# Patient Record
Sex: Male | Born: 1935 | Race: White | Hispanic: No | Marital: Married | Smoking: Former smoker
Health system: Southern US, Community
[De-identification: ages and names within clinical notes are randomized; demographics above are authoritative.]

## PROBLEM LIST (undated history)

## (undated) DIAGNOSIS — N3941 Urge incontinence: Secondary | ICD-10-CM

## (undated) DIAGNOSIS — K439 Ventral hernia without obstruction or gangrene: Secondary | ICD-10-CM

## (undated) DIAGNOSIS — N401 Enlarged prostate with lower urinary tract symptoms: Secondary | ICD-10-CM

## (undated) DIAGNOSIS — F0390 Unspecified dementia without behavioral disturbance: Secondary | ICD-10-CM

## (undated) HISTORY — PX: CATARACT EXTRACTION: SUR2

---

## 2007-09-29 ENCOUNTER — Ambulatory Visit (HOSPITAL_COMMUNITY): Admission: RE | Admit: 2007-09-29 | Discharge: 2007-09-29 | Payer: Self-pay | Admitting: General Surgery

## 2007-10-13 ENCOUNTER — Encounter (INDEPENDENT_AMBULATORY_CARE_PROVIDER_SITE_OTHER): Payer: Self-pay | Admitting: General Surgery

## 2007-10-13 ENCOUNTER — Inpatient Hospital Stay (HOSPITAL_COMMUNITY): Admission: RE | Admit: 2007-10-13 | Discharge: 2007-10-21 | Payer: Self-pay | Admitting: General Surgery

## 2008-03-21 ENCOUNTER — Ambulatory Visit (HOSPITAL_COMMUNITY): Admission: RE | Admit: 2008-03-21 | Discharge: 2008-03-21 | Payer: Self-pay | Admitting: Ophthalmology

## 2008-04-11 ENCOUNTER — Ambulatory Visit (HOSPITAL_COMMUNITY): Admission: RE | Admit: 2008-04-11 | Discharge: 2008-04-11 | Payer: Self-pay | Admitting: Ophthalmology

## 2010-03-02 ENCOUNTER — Emergency Department (HOSPITAL_COMMUNITY)
Admission: EM | Admit: 2010-03-02 | Discharge: 2010-03-02 | Payer: Self-pay | Source: Home / Self Care | Admitting: Emergency Medicine

## 2010-04-21 ENCOUNTER — Encounter: Payer: Self-pay | Admitting: General Surgery

## 2010-06-11 LAB — COMPREHENSIVE METABOLIC PANEL
ALT: 16 U/L (ref 0–53)
AST: 19 U/L (ref 0–37)
Albumin: 3.7 g/dL (ref 3.5–5.2)
Calcium: 9.1 mg/dL (ref 8.4–10.5)
GFR calc Af Amer: 51 mL/min — ABNORMAL LOW (ref >60)
Glucose, Bld: 105 mg/dL — ABNORMAL HIGH (ref 70–99)
Potassium: 4.3 mEq/L (ref 3.5–5.1)
Sodium: 139 mEq/L (ref 135–145)
Total Protein: 6.9 g/dL (ref 6.0–8.3)

## 2010-06-11 LAB — DIFFERENTIAL
Eosinophils Absolute: 0.2 10*3/uL (ref 0.0–0.7)
Lymphs Abs: 1.2 10*3/uL (ref 0.7–4.0)
Monocytes Absolute: 0.8 10*3/uL (ref 0.1–1.0)
Monocytes Relative: 9 % (ref 3–12)
Neutrophils Relative %: 77 % (ref 43–77)

## 2010-06-11 LAB — URINALYSIS, ROUTINE W REFLEX MICROSCOPIC
Glucose, UA: NEGATIVE mg/dL
Leukocytes, UA: NEGATIVE
pH: 6 (ref 5.0–8.0)

## 2010-06-11 LAB — URINE MICROSCOPIC-ADD ON

## 2010-06-11 LAB — CBC
MCHC: 35.7 g/dL (ref 30.0–36.0)
Platelets: 214 10*3/uL (ref 150–400)
RDW: 12.7 % (ref 11.5–15.5)
WBC: 9.4 10*3/uL (ref 4.0–10.5)

## 2010-08-13 NOTE — Discharge Summary (Signed)
Mark Pena, Mark Pena NO.:  [id number]   MEDICAL RECORD NO.:  [id number]          PATIENT TYPE:  INP   LOCATION:  A326                          FACILITY:  [facility]   PHYSICIAN:  Barbaraann Barthel, M.D. DATE OF BIRTH:  Aug 09, 1930   DATE OF ADMISSION:  10/13/2007  DATE OF DISCHARGE:  07/23/2009LH                               DISCHARGE SUMMARY   PROCEDURE:  1. On October 13, 2007, is repair of giant incarcerated right inguinal      hernia with MFT mesh graft  2. Excision of right hydrocele.  3. Right orchiectomy.  4. Umbilical hernia repair.  5. Aspiration of left hydrocele   Note, this is a 77 year old white male who presented with a giant right  inguinal hernia and bilateral hydroceles and an umbilical hernia.  Basically, he had had this incarcerated hernia for 10 years' time and  essentially according to CT scan, was noted to have great portion of his  small bowel in his right colon within his right scrotum.  We discussed  repair and the need for an orchiectomy and the need for a mesh  prosthesis for this we discussed this in detail and informed consent was  obtained and he was taken to surgery via the outpatient department on  October 13, 2007, where a repair was performed and the use of MTF graft was  used.  The patient postoperatively did well.  He did have the expected  large amount of lymphedema and swelling postoperatively.  We kept the  drains in places as much and removed these on the fifth or sixth  postoperative day.  He did well.  He did have some significant tissue  edema, but appeared to have no infection and remained afebrile  postoperatively and his wound remained clean.  Obviously, he was  difficult in ambulation and with this large scrotal edema, this required  prolonged catheterization and he actually did have some pitting edema  which I treated with the Lasix at the time of his discharge.  We  continued his antibiotics perioperatively a little longer  because of the  presence of the drains, however, this was discontinued when the drains  were removed.  I did discharge him on Bactrim because of the indwelling  catheter.  Postoperatively, he did very well.   LABORATORY DATA:  His pathology revealed benign tissue from his  incarcerated omentum and no sign of any malignancy in his scrotal area.  His white count was 5.0 on October 11, 2007, with an H&H of 14.8 and 43.5  and electrolytes were within normal limits.  Sonogram of the scrotum  revealed normal appearing testicles with bilateral hydroceles and a  large right intrascrotal hernia, sac containing numerous loops of small  bowel and large bowel as mentioned, and the CT scan revealed this as  well and obviously, this was found intraoperatively.   Electrocardiogram showed normal sinus rhythm with left bundle-branch  block.   The patient did as well as can be expected where he is in good spirits  and he was discharged on the eighth postoperative day.  At the time of  discharge, his wound was clean and we will keep the staples in place  longer and the catheters in place longer as mentioned above.   DISCHARGE INSTRUCTIONS:  He is discharged on a regular diet.  He is  excused from work.  He is to lie down on bed rest and he is permitted to  walk indoors and outdoors.  He is permitted to shower.  He is told to  clean his wound with alcohol 2 or 3 times a day or as needed.  He is to  do no heavy lifting or driving and no sexual activity.  He is told to  elevate his scrotum as much as possible.   He is discharged on:  1. Darvocet N 100 one tablet every 4 hours as needed for pain.  2. Colace 100 mg daily or 2 times a day as needed for his bowels.  3. Lasix 20 mg daily for 30 days.  4. K-Dur 20 mEq daily.  5. Bactrim DS 1 tablet 2 times a day.  6. Karaya powder around the groin area for irritation as needed.   We will follow up with him.  He is told to contact me, should there be  any  acute changes.  We have an appointment made with him on Tuesday,  October 26, 2007, at 10 o'clock and we will follow him perioperatively.      Barbaraann Barthel, M.D.  Electronically Signed     WB/MEDQ  D:  10/21/2007  T:  10/22/2007  Job:  [id number]

## 2010-08-13 NOTE — Op Note (Signed)
NAMEKASH, DAVIE NO.:  [id number]   MEDICAL RECORD NO.:  [id number]          PATIENT TYPE:  INP   LOCATION:  A326                          FACILITY:  [facility]   PHYSICIAN:  Barbaraann Barthel, M.D. DATE OF BIRTH:  08-20-1930   DATE OF PROCEDURE:  DATE OF DISCHARGE:                               OPERATIVE REPORT   SURGEON:  Barbaraann Barthel, MD   ASSISTANT:  Dr. Rito Ehrlich.   PREOPERATIVE DIAGNOSIS:  Giant incarcerated right inguinal hernia with  bilateral hydroceles and umbilical hernia.   PROCEDURE:  1. Repair of giant right inguinal incarcerated hernia with MTF      prosthesis.  2. Right hydrocelectomy.  3. Repair of incarcerated umbilical hernia.  4. Aspiration of left hydrocele.   NOTE:  This is a 77 year old white male who had a giant right inguinal  hernia for at least 10 years' time.  He was unfortunately nursing to  health his wife who was terminally ill and was unable to have this  repaired.  Preoperative workup showed him to have many loops of small  bowel as well as his right colon within his scrotum.  His scrotum was  down to his midthigh, and he also was noted to have bilateral  hydroceles.  He also had an umbilical hernia with herniated omentum.  We  discussed that repair of this in detail with him, discussing the  complications not limited to but including bleeding, infection, and the  possibility that the right testicle would have to be removed and due to  the large size of his defect and the ability to close the defect.  We  also discussed the use of mesh with this.  We also discussed the  possibility of bleeding and infection and recurrence.  Informed consent  was obtained.   GROSS OPERATIVE FINDINGS:  The patient had huge hernia sac containing  many loops of small bowel with thickened mesentery and the right colon  with normal appendix within it and normal testicle.  The patient had a  small umbilical hernia; however, with considerable  amount of omentum  that had herniated through this.   Specimen therefore was right inguinal hernia sac with right cord and  testicle and no incarcerated omentum from the umbilical hernia or and  the right inguinal hernia sac and then omentum, the tissue from this as  well.   TECHNIQUE:  The patient was placed in the supine position.  After the  adequate of administration of spinal anesthesia, his entire abdomen was  draped with Betadine solution and draped in the usual manner.  A Foley  catheter was aseptically inserted, and this also was prepped.  We made a  transverse incision over the area of the umbilical hernia to see what  size this defect was.  We removed incarcerated hernia.  The total size  of the defect was approximately the size of a silver dollar.  We then  packed this with a gauze, and then our attention was turned to the  scrotal area.  We left this open in case we needed an access from this  area for some  reason during the groin part of the surgery.  Incision was  carried out from the anterosuperior iliac spine on the right side down  into the scrotum through skin, subcutaneous tissues, Scarpa layer, and  opening the external oblique and revealing a large hernia sac.  This was  opened.  The hernia sac was then dissected free from the cord structures  and we had to open the defect somewhat in order to be able to return the  intestinal contents to the abdominal cavity.  We ligated the hernia sac  with 2-0 Bralon and amputated the redundant portion of the sac.  We then  ligated the cord structures with 0 Novofil with suture ligation and  doubly ligated this.  We then closed the defect suturing transversus  abdominis and transversalis fascia to the a piece of MTF graft, it was 8  x 16 and placed this over this area of the defect and sutured fascia,  transversus abdominis, and transversalis fascia all around the  circumference of the defect and suturing this medially to the  fascia in  the area of the tubercle in the Cooper ligament and Poupart ligament  inferiorly.  We used the entire piece of fascia in order to have no  tension or as little tension as possible.  We then irrigated with the  bacitracin and saline solution.  As there was a huge scrotal dilatation,  I elected to leave a Jackson-Pratt drain in the scrotum.  This exited  through a stab wound incision and this was sutured in place with 3-0  nylon.  We then tunneled the fascia from the midline of the abdomen down  to drain over the area of the MTF graft.  This was sutured in place and  as stated was placed through a separate stab wound incision.  I then  closed the subcutaneous tissue over the graft and then closed the skin  with a stapling device.  We then closed the umbilical hernia using old  Novofil suture in an interrupted figure-of-eight fashion in a transverse  manner to close the defect and then closed the skin with a stapling  device.  No drain was placed in this area.  Prior to closure, all  sponge, needle, and instrument counts were found to be correct.  Estimated blood loss was approximately 150 mL.  The patient received  3600 mL of crystalloids intraoperatively.  The patient was taken to the  recovery room in satisfactory condition.  There were no complications.  We did had to convert as this was a long procedure over 3 hours to  general anesthesia towards the end; however, this was not a problem.  The patient was taken to the recovery room in satisfactory condition.      Barbaraann Barthel, M.D.  Electronically Signed     WB/MEDQ  D:  10/13/2007  T:  10/14/2007  Job:  [id number]   cc:   Dr. Rito Ehrlich

## 2010-12-26 LAB — BASIC METABOLIC PANEL
BUN: 11
CO2: 27
Calcium: 9.5
Glucose, Bld: 105 — ABNORMAL HIGH
Sodium: 139

## 2010-12-26 LAB — DIFFERENTIAL
Basophils Absolute: 0
Basophils Relative: 1
Eosinophils Relative: 3
Monocytes Absolute: 0.5
Neutro Abs: 3.1

## 2010-12-26 LAB — CBC
HCT: 43.5
Hemoglobin: 14.8
MCHC: 34
RDW: 12.9

## 2010-12-27 LAB — BASIC METABOLIC PANEL
BUN: 9
Creatinine, Ser: 1.06
GFR calc non Af Amer: >60

## 2010-12-27 LAB — DIFFERENTIAL
Lymphocytes Relative: 6 — ABNORMAL LOW
Lymphs Abs: 0.7
Neutrophils Relative %: 83 — ABNORMAL HIGH

## 2010-12-27 LAB — CBC
Platelets: 218
WBC: 11.3 — ABNORMAL HIGH

## 2011-01-03 LAB — BASIC METABOLIC PANEL
CO2: 29 mEq/L (ref 19–32)
Calcium: 9.2 mg/dL (ref 8.4–10.5)
Chloride: 105 mEq/L (ref 96–112)
GFR calc Af Amer: >60 mL/min (ref >60)
Sodium: 141 mEq/L (ref 135–145)

## 2011-01-03 LAB — HEMOGLOBIN AND HEMATOCRIT, BLOOD: HCT: 44 % (ref 39.0–52.0)

## 2012-10-11 ENCOUNTER — Other Ambulatory Visit (HOSPITAL_COMMUNITY): Payer: Self-pay | Admitting: General Surgery

## 2012-10-11 DIAGNOSIS — R410 Disorientation, unspecified: Secondary | ICD-10-CM

## 2012-10-14 ENCOUNTER — Ambulatory Visit (HOSPITAL_COMMUNITY)
Admission: RE | Admit: 2012-10-14 | Discharge: 2012-10-14 | Disposition: A | Discharge status: Home / Self Care | Payer: Medicare Other | Source: Ambulatory Visit | Attending: General Surgery | Admitting: General Surgery

## 2012-10-14 ENCOUNTER — Other Ambulatory Visit (HOSPITAL_COMMUNITY): Payer: Self-pay | Admitting: General Surgery

## 2012-10-14 DIAGNOSIS — R410 Disorientation, unspecified: Secondary | ICD-10-CM

## 2012-10-14 DIAGNOSIS — I6529 Occlusion and stenosis of unspecified carotid artery: Secondary | ICD-10-CM | POA: Insufficient documentation

## 2012-10-14 DIAGNOSIS — G319 Degenerative disease of nervous system, unspecified: Secondary | ICD-10-CM | POA: Insufficient documentation

## 2012-10-14 DIAGNOSIS — R4182 Altered mental status, unspecified: Secondary | ICD-10-CM | POA: Insufficient documentation

## 2015-05-03 ENCOUNTER — Inpatient Hospital Stay (HOSPITAL_COMMUNITY): Payer: Medicare (Managed Care)

## 2015-05-03 ENCOUNTER — Emergency Department (HOSPITAL_COMMUNITY): Payer: Medicare (Managed Care)

## 2015-05-03 ENCOUNTER — Inpatient Hospital Stay (HOSPITAL_COMMUNITY)
Admission: EM | Admit: 2015-05-03 | Discharge: 2015-05-07 | DRG: 071 | Disposition: A | Discharge status: Skilled Nursing Facility | Payer: Medicare (Managed Care) | Attending: Internal Medicine | Admitting: Internal Medicine

## 2015-05-03 ENCOUNTER — Encounter (HOSPITAL_COMMUNITY): Payer: Self-pay | Admitting: Emergency Medicine

## 2015-05-03 DIAGNOSIS — N400 Enlarged prostate without lower urinary tract symptoms: Secondary | ICD-10-CM | POA: Diagnosis present

## 2015-05-03 DIAGNOSIS — K920 Hematemesis: Secondary | ICD-10-CM | POA: Diagnosis not present

## 2015-05-03 DIAGNOSIS — G9340 Encephalopathy, unspecified: Secondary | ICD-10-CM | POA: Diagnosis present

## 2015-05-03 DIAGNOSIS — Z66 Do not resuscitate: Secondary | ICD-10-CM | POA: Diagnosis not present

## 2015-05-03 DIAGNOSIS — R2681 Unsteadiness on feet: Secondary | ICD-10-CM | POA: Diagnosis present

## 2015-05-03 DIAGNOSIS — N189 Chronic kidney disease, unspecified: Secondary | ICD-10-CM

## 2015-05-03 DIAGNOSIS — R6510 Systemic inflammatory response syndrome (SIRS) of non-infectious origin without acute organ dysfunction: Secondary | ICD-10-CM | POA: Diagnosis present

## 2015-05-03 DIAGNOSIS — L89321 Pressure ulcer of left buttock, stage 1: Secondary | ICD-10-CM | POA: Diagnosis not present

## 2015-05-03 DIAGNOSIS — F0150 Vascular dementia without behavioral disturbance: Secondary | ICD-10-CM | POA: Diagnosis present

## 2015-05-03 DIAGNOSIS — K439 Ventral hernia without obstruction or gangrene: Secondary | ICD-10-CM | POA: Diagnosis present

## 2015-05-03 DIAGNOSIS — R4182 Altered mental status, unspecified: Secondary | ICD-10-CM

## 2015-05-03 DIAGNOSIS — N179 Acute kidney failure, unspecified: Secondary | ICD-10-CM | POA: Diagnosis present

## 2015-05-03 DIAGNOSIS — T50995A Adverse effect of other drugs, medicaments and biological substances, initial encounter: Secondary | ICD-10-CM | POA: Diagnosis present

## 2015-05-03 DIAGNOSIS — Z9181 History of falling: Secondary | ICD-10-CM

## 2015-05-03 DIAGNOSIS — Z79899 Other long term (current) drug therapy: Secondary | ICD-10-CM

## 2015-05-03 DIAGNOSIS — L89151 Pressure ulcer of sacral region, stage 1: Secondary | ICD-10-CM | POA: Diagnosis present

## 2015-05-03 DIAGNOSIS — R0602 Shortness of breath: Secondary | ICD-10-CM

## 2015-05-03 DIAGNOSIS — F0151 Vascular dementia with behavioral disturbance: Secondary | ICD-10-CM | POA: Diagnosis present

## 2015-05-03 DIAGNOSIS — R509 Fever, unspecified: Secondary | ICD-10-CM | POA: Diagnosis not present

## 2015-05-03 DIAGNOSIS — N183 Chronic kidney disease, stage 3 (moderate): Secondary | ICD-10-CM | POA: Diagnosis present

## 2015-05-03 DIAGNOSIS — L8990 Pressure ulcer of unspecified site, unspecified stage: Secondary | ICD-10-CM | POA: Insufficient documentation

## 2015-05-03 HISTORY — DX: Benign prostatic hyperplasia with lower urinary tract symptoms: N40.1

## 2015-05-03 HISTORY — DX: Unspecified dementia, unspecified severity, without behavioral disturbance, psychotic disturbance, mood disturbance, and anxiety: F03.90

## 2015-05-03 HISTORY — DX: Urge incontinence: N39.41

## 2015-05-03 HISTORY — DX: Ventral hernia without obstruction or gangrene: K43.9

## 2015-05-03 LAB — CBC WITH DIFFERENTIAL/PLATELET
BASOS ABS: 0 10*3/uL (ref 0.0–0.1)
Basophils Relative: 0 %
EOS ABS: 0 10*3/uL (ref 0.0–0.7)
Eosinophils Relative: 0 %
HCT: 44 % (ref 39.0–52.0)
HEMOGLOBIN: 15.2 g/dL (ref 13.0–17.0)
LYMPHS ABS: 0.6 10*3/uL — AB (ref 0.7–4.0)
LYMPHS PCT: 6 %
MCH: 32.1 pg (ref 26.0–34.0)
MCHC: 34.5 g/dL (ref 30.0–36.0)
MCV: 92.8 fL (ref 78.0–100.0)
Monocytes Absolute: 0.7 10*3/uL (ref 0.1–1.0)
Monocytes Relative: 7 %
NEUTROS PCT: 87 %
Neutro Abs: 9.1 10*3/uL — ABNORMAL HIGH (ref 1.7–7.7)
Platelets: 236 10*3/uL (ref 150–400)
RBC: 4.74 MIL/uL (ref 4.22–5.81)
RDW: 12.9 % (ref 11.5–15.5)
WBC: 10.4 10*3/uL (ref 4.0–10.5)

## 2015-05-03 LAB — URINALYSIS, ROUTINE W REFLEX MICROSCOPIC
Glucose, UA: NEGATIVE mg/dL
HGB URINE DIPSTICK: NEGATIVE
Ketones, ur: 40 mg/dL — AB
LEUKOCYTES UA: NEGATIVE
Nitrite: NEGATIVE
PROTEIN: NEGATIVE mg/dL
SPECIFIC GRAVITY, URINE: 1.029 (ref 1.005–1.030)
pH: 5.5 (ref 5.0–8.0)

## 2015-05-03 LAB — POCT I-STAT 3, ART BLOOD GAS (G3+)
ACID-BASE DEFICIT: 2 mmol/L (ref 0.0–2.0)
Bicarbonate: 22.7 mEq/L (ref 20.0–24.0)
O2 SAT: 91 %
PH ART: 7.382 (ref 7.350–7.450)
TCO2: 24 mmol/L (ref 0–100)
pCO2 arterial: 38.5 mmHg (ref 35.0–45.0)
pO2, Arterial: 64 mmHg — ABNORMAL LOW (ref 80.0–100.0)

## 2015-05-03 LAB — COMPREHENSIVE METABOLIC PANEL
ALT: 16 U/L — ABNORMAL LOW (ref 17–63)
ANION GAP: 12 (ref 5–15)
AST: 42 U/L — ABNORMAL HIGH (ref 15–41)
Albumin: 3.3 g/dL — ABNORMAL LOW (ref 3.5–5.0)
Alkaline Phosphatase: 84 U/L (ref 38–126)
BUN: 23 mg/dL — ABNORMAL HIGH (ref 6–20)
CHLORIDE: 107 mmol/L (ref 101–111)
CO2: 22 mmol/L (ref 22–32)
Calcium: 8.9 mg/dL (ref 8.9–10.3)
Creatinine, Ser: 1.14 mg/dL (ref 0.61–1.24)
GFR calc non Af Amer: 58 mL/min — ABNORMAL LOW (ref >60)
Glucose, Bld: 120 mg/dL — ABNORMAL HIGH (ref 65–99)
POTASSIUM: 4.9 mmol/L (ref 3.5–5.1)
SODIUM: 141 mmol/L (ref 135–145)
Total Bilirubin: 2.2 mg/dL — ABNORMAL HIGH (ref 0.3–1.2)
Total Protein: 6.7 g/dL (ref 6.5–8.1)

## 2015-05-03 LAB — I-STAT CG4 LACTIC ACID, ED
LACTIC ACID, VENOUS: 1.24 mmol/L (ref 0.5–2.0)
Lactic Acid, Venous: 1.27 mmol/L (ref 0.5–2.0)

## 2015-05-03 LAB — MRSA PCR SCREENING: MRSA BY PCR: NEGATIVE

## 2015-05-03 LAB — VALPROIC ACID LEVEL

## 2015-05-03 MED ORDER — SODIUM CHLORIDE 0.9 % IV BOLUS (SEPSIS)
1000.0000 mL | Freq: Once | INTRAVENOUS | Status: AC
  Administered 2015-05-03: 1000 mL via INTRAVENOUS

## 2015-05-03 MED ORDER — ONDANSETRON HCL 4 MG PO TABS: 4.0000 mg | ORAL_TABLET | Freq: Four times a day (QID) | ORAL | Status: DC | PRN

## 2015-05-03 MED ORDER — PIPERACILLIN-TAZOBACTAM 3.375 G IVPB 30 MIN
3.3750 g | Freq: Once | INTRAVENOUS | Status: AC
  Administered 2015-05-03: 3.375 g via INTRAVENOUS
  Filled 2015-05-03: qty 50

## 2015-05-03 MED ORDER — LORAZEPAM 2 MG/ML IJ SOLN
1.0000 mg | Freq: Once | INTRAMUSCULAR | Status: AC | PRN
  Administered 2015-05-03: 1 mg via INTRAVENOUS
  Filled 2015-05-03: qty 1

## 2015-05-03 MED ORDER — PANTOPRAZOLE SODIUM 40 MG IV SOLR
40.0000 mg | Freq: Two times a day (BID) | INTRAVENOUS | Status: DC
  Administered 2015-05-03 – 2015-05-04 (×3): 40 mg via INTRAVENOUS
  Filled 2015-05-03 (×3): qty 40

## 2015-05-03 MED ORDER — ENOXAPARIN SODIUM 40 MG/0.4ML ~~LOC~~ SOLN
40.0000 mg | SUBCUTANEOUS | Status: DC
  Administered 2015-05-03 – 2015-05-06 (×3): 40 mg via SUBCUTANEOUS
  Filled 2015-05-03 (×3): qty 0.4

## 2015-05-03 MED ORDER — DIVALPROEX SODIUM 125 MG PO CSDR
125.0000 mg | DELAYED_RELEASE_CAPSULE | Freq: Two times a day (BID) | ORAL | Status: DC
  Filled 2015-05-03 (×2): qty 1

## 2015-05-03 MED ORDER — VANCOMYCIN HCL IN DEXTROSE 1-5 GM/200ML-% IV SOLN
1000.0000 mg | Freq: Once | INTRAVENOUS | Status: AC
  Administered 2015-05-03: 1000 mg via INTRAVENOUS
  Filled 2015-05-03: qty 200

## 2015-05-03 MED ORDER — RISPERIDONE 0.5 MG PO TABS
0.2500 mg | ORAL_TABLET | Freq: Every day | ORAL | Status: DC
  Filled 2015-05-03: qty 1

## 2015-05-03 MED ORDER — FINASTERIDE 5 MG PO TABS
5.0000 mg | ORAL_TABLET | Freq: Every day | ORAL | Status: DC
  Administered 2015-05-06 – 2015-05-07 (×2): 5 mg via ORAL
  Filled 2015-05-03 (×2): qty 1

## 2015-05-03 MED ORDER — SODIUM CHLORIDE 0.9 % IV SOLN: INTRAVENOUS | Status: AC

## 2015-05-03 MED ORDER — TAMSULOSIN HCL 0.4 MG PO CAPS
0.4000 mg | ORAL_CAPSULE | Freq: Every day | ORAL | Status: DC
  Administered 2015-05-06 – 2015-05-07 (×2): 0.4 mg via ORAL
  Filled 2015-05-03 (×2): qty 1

## 2015-05-03 MED ORDER — SODIUM CHLORIDE 0.9 % IV SOLN
INTRAVENOUS | Status: DC
  Administered 2015-05-03: 08:00:00 via INTRAVENOUS

## 2015-05-03 MED ORDER — ALBUTEROL SULFATE (2.5 MG/3ML) 0.083% IN NEBU: 2.5000 mg | INHALATION_SOLUTION | RESPIRATORY_TRACT | Status: DC | PRN

## 2015-05-03 MED ORDER — SODIUM CHLORIDE 0.9 % IV SOLN: INTRAVENOUS | Status: DC

## 2015-05-03 MED ORDER — ONDANSETRON HCL 4 MG/2ML IJ SOLN: 4.0000 mg | Freq: Four times a day (QID) | INTRAMUSCULAR | Status: DC | PRN

## 2015-05-03 MED ORDER — VANCOMYCIN HCL IN DEXTROSE 750-5 MG/150ML-% IV SOLN
750.0000 mg | Freq: Two times a day (BID) | INTRAVENOUS | Status: DC
  Filled 2015-05-03: qty 150

## 2015-05-03 MED ORDER — PIPERACILLIN-TAZOBACTAM 3.375 G IVPB
3.3750 g | Freq: Three times a day (TID) | INTRAVENOUS | Status: DC
  Administered 2015-05-03 – 2015-05-04 (×3): 3.375 g via INTRAVENOUS
  Filled 2015-05-03 (×6): qty 50

## 2015-05-03 MED ORDER — ACETAMINOPHEN 325 MG PO TABS: 650.0000 mg | ORAL_TABLET | Freq: Four times a day (QID) | ORAL | Status: DC | PRN

## 2015-05-03 MED ORDER — ACETAMINOPHEN 650 MG RE SUPP: 650.0000 mg | Freq: Four times a day (QID) | RECTAL | Status: DC | PRN

## 2015-05-03 NOTE — Progress Notes (Signed)
Patient arrived to room 5M15 from unit 2H at this time. Easily arousable.Mark Pena

## 2015-05-03 NOTE — H&P (Addendum)
[facility] History and Physical  Mark Pena ZOX:[id number] DOB: 04/21/1930 DOA: 05/03/2015  Referring physician: ED PCP: Mark Hatcher, MD   Chief Complaint: Altered mental status  HPI:  Mark Pena is a 82 year old male with a past medical history of vascular dementia and BPH; presents from [facility] nursing facility with complaints of acute altered mental status and thought to have vomited coffee ground. Patient's history is obtained from his wife as he is acutely altered and in not responding to verbal commands.   Patient wife states that he had been living at home and going to the pace program during the day until approximately 3 days ago when they recommended the patient to go into [facility] nursing facility for medication adjustments. Wife noted that he had been becoming more confused lately and needing more care. Reporting increased sundowning from the history of vascular dementia. She notes that he would be up multiple times throughout the night and had just last fallen 5 days ago. Patient was able to get back up without problem as she states he usually does. Wife notes that she was called around 12:45 AM this morning and told that the patient was responsive and coughing up coffee ground material thought to be blood. She she was told by the facility that he had eaten his lunch as normal. She notes that prior to joining the pace program. Previously he had only been on on vitamin, finasteride, and tamulosin. After he joined the pace program she states he was placed on risperidone and mirtazapineand has been on this for approximately a week or so.  She states she's not sure but she thinks Depakote and Haldol were added at [facility] nursing facility. They state his last dose of medication was Monday, but records are not available. Patient was noted to have a fever 100.4 at the nursing facility. At baseline the patient is able to feed himself, hold normal conversation, and  walk without the assistance of a cane. She notes she is unsure if patient has a new facial droop as he does not have his dentures and cannot be sure.   Upon arrival the patient is seen to have a facial droop, but blood levels appear to be within normal limits.  he is found to be initially tachycardic with heart rate of 104 and a low-grade fever of 100.45F. Initial CT scan showed no signs of acute infarct chest x-ray showed. Low lung volumes but it was suspected that the patient possibly could be aspirating. Wife states that she wants patient to be a full code but is unsure of whether she would like the patient to be intubated are not if needed.    Review of Systems  Unable to perform ROS: patient unresponsive     Past Medical History  Diagnosis Date  . Dementia   . Benign prostatic hyperplasia (BPH) with urinary urge incontinence   . Abdominal wall hernia      History reviewed. No pertinent past surgical history.    Social History:  reports that he does not drink alcohol. His tobacco and drug histories are not on file.   No Known Allergies  History reviewed. No pertinent family history.      Prior to Admission medications   Medication Sig Start Date End Date Taking? Authorizing Provider  divalproex (DEPAKOTE SPRINKLE) 125 MG capsule Take 125 mg by mouth 2 (two) times daily.   Yes Historical Provider, MD  finasteride (PROSCAR) 5 MG tablet Take 5 mg by mouth  daily.   Yes Historical Provider, MD  haloperidol (HALDOL) 0.5 MG tablet Take 0.5 mg by mouth every 6 (six) hours as needed for agitation.   Yes Historical Provider, MD  mirtazapine (REMERON) 15 MG tablet Take 15 mg by mouth at bedtime.   Yes Historical Provider, MD  risperiDONE (RISPERDAL) 0.25 MG tablet Take 0.25 mg by mouth 2 (two) times daily.   Yes Historical Provider, MD  tamsulosin (FLOMAX) 0.4 MG CAPS capsule Take 0.4 mg by mouth daily.   Yes Historical Provider, MD  Vitamin D, Ergocalciferol, (DRISDOL) 50000 units  CAPS capsule Take 50,000 Units by mouth every 30 (thirty) days.   Yes Historical Provider, MD     Physical Exam: Filed Vitals:   05/03/15 0448 05/03/15 0517 05/03/15 0530 05/03/15 0545  BP: 131/71 123/68 133/71 122/67  Pulse: 94 87 90 86  Temp:      TempSrc:      Resp: 24 24 31 23   Height:      Weight:      SpO2: 96% 98% 97% 99%     Constitutional: Vital signs reviewed. Patient is lethargic with intermittent weak cough minimally arousable to verbal stimuli from wife Head: Normocephalic and atraumatic  Ear: TM normal bilaterally  Mouth: no erythema or exudates,  dry mucous membranes cannot appreciate coffee-ground hematemesis  Eyes: PERRL, EOMI, conjunctivae normal, No scleral icterus.  Neck: Supple, Trachea midline normal ROM, No JVD, mass, thyromegaly, or carotid bruit present.  Cardiovascular: RRR, S1 normal, S2 normal, no MRG, pulses symmetric and intact bilaterally  Pulmonary/Chest:decreased overall air movement Abdominal: Soft. Non-tender, non-distended, RLQ ventral wall hernia that appears reducible,positive bowel sounds in all 4 quadrants no guarding present. GU: no CVA tenderness Musculoskeletal: No joint deformities, erythema, or stiffness, ROM full and no nontender Ext: no edema and no cyanosis, pulses palpable bilaterally (DP and PT)  Hematology: no cervical, inginal, or axillary adenopathy.  Neurological: Able to move all extremities with painful stimuli. Appears to have a right-sided facial droop  Skin: Warm, dry. Pressure ulcer present  Psychiatric: Normal mood and affect. speech and behavior is normal. Judgment and thought content normal. Cognition and memory are normal.      Data Review   Micro Results No results found for this or any previous visit (from the past 240 hour(s)).  Radiology Reports Dg Chest 2 View  05/03/2015  CLINICAL DATA:  Altered mental status. EXAM: CHEST  2 VIEW COMPARISON:  11/04/2014 FINDINGS: Low volumes with streaky basilar opacity.  Chronic cardiomegaly. Vascular pedicle widening which is greater than prior, but may be related to rightward rotation. No edema, effusion, or air leak. IMPRESSION: Low volumes with basilar atelectasis. Electronically Signed   By: Marnee Spring M.D.   On: 05/03/2015 02:32   Ct Head Wo Contrast  05/03/2015  CLINICAL DATA:  Coffee-ground emesis with altered mental status and left-sided weakness. EXAM: CT HEAD WITHOUT CONTRAST TECHNIQUE: Contiguous axial images were obtained from the base of the skull through the vertex without intravenous contrast. COMPARISON:  11/04/2014 FINDINGS: Skull and Sinuses:Negative for fracture or destructive process. Chronic sinusitis with diffuse mucosal thickening. Visualized orbits: Bilateral cataract resection. Brain: Advanced chronic small vessel disease with ischemic gliosis confluent throughout the bilateral cerebral white matter and present in the pons. Indistinct and low-density basal ganglia on a chronic basis, likely ischemic gliosis and dilated perivascular spaces from hypertension. Chronic bilateral thalamic lacunar infarcts. Chronic small vessel infarct in the peripheral right cerebellum. Chronic ventriculomegaly, favor atrophy over chronic communicating hydrocephalus. IMPRESSION:  1. No acute finding or change from 2016. 2. Advanced chronic small vessel disease. Electronically Signed   By: Marnee Spring M.D.   On: 05/03/2015 04:56     CBC  Recent Labs Lab 05/03/15 0305  WBC 10.4  HGB 15.2  HCT 44.0  PLT 236  MCV 92.8  MCH 32.1  MCHC 34.5  RDW 12.9  LYMPHSABS 0.6*  MONOABS 0.7  EOSABS 0.0  BASOSABS 0.0    Chemistries   Recent Labs Lab 05/03/15 0305  NA 141  K 4.9  CL 107  CO2 22  GLUCOSE 120*  BUN 23*  CREATININE 1.14  CALCIUM 8.9  AST 42*  ALT 16*  ALKPHOS 84  BILITOT 2.2*   ------------------------------------------------------------------------------------------------------------------ estimated creatinine clearance is 54.8  mL/min (by C-G formula based on Cr of 1.14). ------------------------------------------------------------------------------------------------------------------ No results for input(s): HGBA1C in the last 72 hours. ------------------------------------------------------------------------------------------------------------------ No results for input(s): CHOL, HDL, LDLCALC, TRIG, CHOLHDL, LDLDIRECT in the last 72 hours. ------------------------------------------------------------------------------------------------------------------ No results for input(s): TSH, T4TOTAL, T3FREE, THYROIDAB in the last 72 hours.  Invalid input(s): FREET3 ------------------------------------------------------------------------------------------------------------------ No results for input(s): VITAMINB12, FOLATE, FERRITIN, TIBC, IRON, RETICCTPCT in the last 72 hours.  Coagulation profile No results for input(s): INR, PROTIME in the last 168 hours.  No results for input(s): DDIMER in the last 72 hours.  Cardiac Enzymes No results for input(s): CKMB, TROPONINI, MYOGLOBIN in the last 168 hours.  Invalid input(s): CK ------------------------------------------------------------------------------------------------------------------ Invalid input(s): POCBNP   CBG: No results for input(s): GLUCAP in the last 168 hours.     EKG: Independently reviewed. sinus tachycardia with a heart rate of 106.    Assessment/Plan  Acute encephalopathy:  patient acutely altered wife notes multiple recent addition's medicine including Haldol and Depakote. Last dose is unknown. CT of the brain showed no acute abnormalities. Question of patient symptoms being secondary to recent medications versus possibility of acute stroke versus underlying infection.  - Admit to stepdown unit  - Obtain records from [facility] regarding  medication administration - Continuous pulse oximetry with [place] Oxygen to keep O2 sats greater than 92% -  check MRI to rule out the possibility of underlying infarct - Check ABG now - Hold home medications    SIRS/Possible Sepsis: Acute. Patient with temperatures up to 100.61F at nursing facility, with increased respiratory rate, and mild tachycardia on admission.  UA wnl. Chest x-ray showed low lung volumes. Patient possibly aspirated secondary to decreased level of consciousness. - Follow-up blood cultures - aspiration precautions head of bed at 30  - Empiric Abx Vancomycin and Zosyn  - Tylenol prn fever  Possible hematemesis:  Per report prior to arrival  not seen on admission. H&H appears within normal limits on admission. - recheck H&H - Protonix 40 mg IV every 12 hours - Zofran as needed for nausea    Possible acute kidney injury on chronic kidney disease: Baseline creatinine appears previously had to have been less than 1 multiple years ago. Acutely on admission patient's creatinine is 1.14 with a BUN elevated at 23. Suspect some aspect of prerenal cause.  - Gentle IV fluids   History of vascular dementia: Wife notes the patient had been calm more difficult to take care home because of sundowning. - Consult social work  Elevated AST: Mild elevation of AST at 42 - Continue to monitor  Falls  - Physical therapy   BPH -Hold as patient is unresponsive   Pressure ulcer - Consult wound care - Lower air  mattress  Code Status:  full please rediscuss with family to solidify  Family Communication: bedside Disposition Plan: admit   Total time spent 55 minutes.Greater than 50% of this time was spent in counseling, explanation of diagnosis, planning of further management, and coordination of care  Clydie Braun [facility] Pager [phone]  If 7PM-7AM, please contact night-coverage [email or web address] Password [email or web address] 05/03/2015, 5:59 AM

## 2015-05-03 NOTE — Consult Note (Addendum)
WOC wound consult note Reason for Consult: Consult requested for sacrum Wound type: Stage 1 Pressure Ulcer POA: Yes Measurement: Approx 2X2cm nonblanchable red area to sacrum/upper buttocks Dressing procedure/placement/frequency: Barrier cream to protect and repel moisture, position off affected area. Please re-consult if further assistance is needed.  Thank-you,  Cammie Mcgee MSN, RN, CWOCN, [place], CNS [phone]

## 2015-05-03 NOTE — Progress Notes (Signed)
SLP Cancellation Note  Patient Details Name: Mark Pena MRN: [id number] DOB: June 28, 1930   Cancelled treatment:        Attempted swallow assessment. Unable to adequately wake pt for evaluation. Will return next date.   Royce Macadamia 05/03/2015, 2:22 PM  Breck Coons Lonell Face.Ed [employer] [phone]

## 2015-05-03 NOTE — ED Notes (Signed)
Dr.Ward at bedside with wife, discussing intubation options

## 2015-05-03 NOTE — Progress Notes (Signed)
Pharmacy Antibiotic Note  TRAMEL WESTBROOK is a 82 y.o. male admitted on 05/03/2015 with emesis/AMS/rule out sepsis.  Pharmacy has been consulted for Vancomycin/Zosyn.   Plan: -Vancomycin 750 mg IV q12h -Zosyn 3.375G IV q8h to be infused over 4 hours -Trend WBC, temp, renal function  -Drug levels as indicated   Height: 6' (182.9 cm) Weight: 201 lb (91.173 kg) IBW/kg (Calculated) : 77.6  Temp (24hrs), Avg:100.1 F (37.8 C), Min:100.1 F (37.8 C), Max:100.1 F (37.8 C)   Recent Labs Lab 05/03/15 0305 05/03/15 0317 05/03/15 0538  WBC 10.4  --   --   CREATININE 1.14  --   --   LATICACIDVEN  --  1.24 1.27    Estimated Creatinine Clearance: 54.8 mL/min (by C-G formula based on Cr of 1.14).    No Known Allergies   Thank you for allowing pharmacy to be a part of this patient's care.  Abran Duke 05/03/2015 5:55 AM

## 2015-05-03 NOTE — Progress Notes (Signed)
MD wants patient to be medicated prior to MRI, orders written. Notified Claudia in MRI that patient has orders for pre medication prior to MRI. MRI to notify nurse when they are ready to re attempt MRI.

## 2015-05-03 NOTE — Progress Notes (Addendum)
PATIENT DETAILS Name: Mark Pena Age: 82 y.o. Sex: male Date of Birth: July 05, 1930 Admit Date: 05/03/2015 Admitting Physician Clydie Braun, MD ZOX:[id number] S, MD  Subjective: Awake-mostly alert this morning. No further vomiting or hematemesis.  Assessment/Plan: Principal Problem: Acute encephalopathy: Not sure if this is related to recent initiation of multiple medications-with low-grade fever patient-and history of dementia-could have some mild aspiration pneumonia causing worsening confusion as well. Alternatively-worsening confusion could be part of his dementia as well. Await MRI brain. Currently seems alert and answering some questions appropriately. Discontinue vancomycin, for now would continue Zosyn-resume Depakote and Risperdal daily at bedtime and follow clinical course. Transfer to MedSurg unit.  Active Problems: Fever/  SIRS (systemic inflammatory response syndrome): No foci of infection evident-given mental status-always some suspicion of mild aspiration pneumonia not seen on chest x-ray. Stop vancomycin, continue Zosyn today-repeat chest x-ray in a.m.-suspect we could discontinue or even narrow down antibiotics further tomorrow. Follow cultures  Hematemesis: One episode only-hemoglobin stable. No indication for endoscopic studies at this time. Would only pursue GI consultation/endoscopic studies if significant drop in hemoglobin or recurrent hematemesis.   ARF: Suspect prerenal azotemia, resolved with hydration. Check electrolytes periodically.  Vascular dementia: see above-resuming low-Depakote and Risperdal daily at bedtime. Follow.   Patient follows with [facility] program-internal medicine teaching service will resume primary service on 05/04/15   Disposition: Remain inpatient-transfer to med surg  Antimicrobial agents  See below  Anti-infectives    Start     Dose/Rate Route Frequency Ordered Stop   05/03/15 1800  vancomycin (VANCOCIN)  IVPB 750 mg/150 ml premix  Status:  Discontinued     750 mg 150 mL/hr over 60 Minutes Intravenous Every 12 hours 05/03/15 0558 05/03/15 1033   05/03/15 1400  piperacillin-tazobactam (ZOSYN) IVPB 3.375 g     3.375 g 12.5 mL/hr over 240 Minutes Intravenous 3 times per day 05/03/15 0558     05/03/15 0400  piperacillin-tazobactam (ZOSYN) IVPB 3.375 g     3.375 g 100 mL/hr over 30 Minutes Intravenous  Once 05/03/15 0349 05/03/15 0530   05/03/15 0400  vancomycin (VANCOCIN) IVPB 1000 mg/200 mL premix     1,000 mg 200 mL/hr over 60 Minutes Intravenous  Once 05/03/15 0349 05/03/15 0601      DVT Prophylaxis: Prophylactic Lovenox   Code Status: DNI  Family Communication Spouse over the phone  Procedures: None  CONSULTS:  None  Time spent 30 minutes-Greater than 50% of this time was spent in counseling, explanation of diagnosis, planning of further management, and coordination of care.  MEDICATIONS: Scheduled Meds: . divalproex  125 mg Oral BID  . enoxaparin (LOVENOX) injection  40 mg Subcutaneous Q24H  . finasteride  5 mg Oral Daily  . pantoprazole (PROTONIX) IV  40 mg Intravenous Q12H  . piperacillin-tazobactam (ZOSYN)  IV  3.375 g Intravenous 3 times per day  . risperiDONE  0.25 mg Oral QHS  . tamsulosin  0.4 mg Oral Daily   Continuous Infusions: . sodium chloride 75 mL/hr at 05/03/15 0814   PRN Meds:.acetaminophen **OR** acetaminophen, albuterol, ondansetron **OR** ondansetron (ZOFRAN) IV    PHYSICAL EXAM: Vital signs in last 24 hours: Filed Vitals:   05/03/15 0545 05/03/15 0637 05/03/15 0645 05/03/15 0738  BP: 122/67 113/70 112/74 129/76  Pulse: 86 98 95 89  Temp:   97.9 F (36.6 C) 97.9 F (36.6 C)  TempSrc:   Oral Oral  Resp: 23  Height:  6' (1.829 m)    Weight:  88.4 kg (194 lb 14.2 oz)    SpO2: 99% 95% 94% 94%    Weight change:  Filed Weights   05/03/15 0444 05/03/15 0637  Weight: 91.173 kg (201 lb) 88.4 kg (194 lb 14.2 oz)   Body mass  index is 26.43 kg/(m^2).   Gen Exam: Awake-pleasantly confused-"mumbled speech".   Neck: Supple, No JVD.   Chest: B/L Clear.   CVS: S1 S2 Regular, no murmurs. Abdomen: soft, BS +, non tender, non distended.  Extremities: no edema, lower extremities warm to touch. Neurologic: Non Focal-but gen weakness   Skin: No Rash.   Wounds: N/A.    Intake/Output from previous day:  Intake/Output Summary (Last 24 hours) at 05/03/15 1037 Last data filed at 05/03/15 0325  Gross per 24 hour  Intake      0 ml  Output    200 ml  Net   -200 ml     LAB RESULTS: CBC  Recent Labs Lab 05/03/15 0305  WBC 10.4  HGB 15.2  HCT 44.0  PLT 236  MCV 92.8  MCH 32.1  MCHC 34.5  RDW 12.9  LYMPHSABS 0.6*  MONOABS 0.7  EOSABS 0.0  BASOSABS 0.0    Chemistries   Recent Labs Lab 05/03/15 0305  NA 141  K 4.9  CL 107  CO2 22  GLUCOSE 120*  BUN 23*  CREATININE 1.14  CALCIUM 8.9    CBG: No results for input(s): GLUCAP in the last 168 hours.  GFR Estimated Creatinine Clearance: 54.8 mL/min (by C-G formula based on Cr of 1.14).  Coagulation profile No results for input(s): INR, PROTIME in the last 168 hours.  Cardiac Enzymes No results for input(s): CKMB, TROPONINI, MYOGLOBIN in the last 168 hours.  Invalid input(s): CK  Invalid input(s): POCBNP No results for input(s): DDIMER in the last 72 hours. No results for input(s): HGBA1C in the last 72 hours. No results for input(s): CHOL, HDL, LDLCALC, TRIG, CHOLHDL, LDLDIRECT in the last 72 hours. No results for input(s): TSH, T4TOTAL, T3FREE, THYROIDAB in the last 72 hours.  Invalid input(s): FREET3 No results for input(s): VITAMINB12, FOLATE, FERRITIN, TIBC, IRON, RETICCTPCT in the last 72 hours. No results for input(s): LIPASE, AMYLASE in the last 72 hours.  Urine Studies No results for input(s): UHGB, CRYS in the last 72 hours.  Invalid input(s): UACOL, UAPR, USPG, UPH, UTP, UGL, UKET, UBIL, UNIT, UROB, ULEU, UEPI, UWBC, URBC,  UBAC, CAST, UCOM, BILUA  MICROBIOLOGY: Recent Results (from the past 240 hour(s))  MRSA PCR Screening     Status: None   Collection Time: 05/03/15  7:53 AM  Result Value Ref Range Status   MRSA by PCR NEGATIVE NEGATIVE Final    Comment:        The GeneXpert MRSA Assay (FDA approved for NASAL specimens only), is one component of a comprehensive MRSA colonization surveillance program. It is not intended to diagnose MRSA infection nor to guide or monitor treatment for MRSA infections.     RADIOLOGY STUDIES/RESULTS: Dg Chest 2 View  05/03/2015  CLINICAL DATA:  Altered mental status. EXAM: CHEST  2 VIEW COMPARISON:  11/04/2014 FINDINGS: Low volumes with streaky basilar opacity. Chronic cardiomegaly. Vascular pedicle widening which is greater than prior, but may be related to rightward rotation. No edema, effusion, or air leak. IMPRESSION: Low volumes with basilar atelectasis. Electronically Signed   By: Marnee Spring M.D.   On: 05/03/2015 02:32  Ct Head Wo Contrast  05/03/2015  CLINICAL DATA:  Coffee-ground emesis with altered mental status and left-sided weakness. EXAM: CT HEAD WITHOUT CONTRAST TECHNIQUE: Contiguous axial images were obtained from the base of the skull through the vertex without intravenous contrast. COMPARISON:  11/04/2014 FINDINGS: Skull and Sinuses:Negative for fracture or destructive process. Chronic sinusitis with diffuse mucosal thickening. Visualized orbits: Bilateral cataract resection. Brain: Advanced chronic small vessel disease with ischemic gliosis confluent throughout the bilateral cerebral white matter and present in the pons. Indistinct and low-density basal ganglia on a chronic basis, likely ischemic gliosis and dilated perivascular spaces from hypertension. Chronic bilateral thalamic lacunar infarcts. Chronic small vessel infarct in the peripheral right cerebellum. Chronic ventriculomegaly, favor atrophy over chronic communicating hydrocephalus. IMPRESSION: 1.  No acute finding or change from 2016. 2. Advanced chronic small vessel disease. Electronically Signed   By: Marnee Spring M.D.   On: 05/03/2015 04:56    Jeoffrey Massed, MD  [facility] Pager:[phone]  If 7PM-7AM, please contact night-coverage [email or web address] Password [email or web address] 05/03/2015, 10:37 AM   LOS: 0 days

## 2015-05-03 NOTE — Progress Notes (Signed)
Report called to [facility] RN wife made aware of transfer. Patient to be transferred to Rm# [id number] via bed.

## 2015-05-03 NOTE — ED Notes (Signed)
Per EMS:  EMS called to [facility] for 1 coffee ground emesis episode from patient.  EMS states this facility often does not clean their patients up before arrival.  No sign of emesis on or around the patient.  Facility states patient is less aware than normal.  Pt is also warm to the touch and tachycardic.  Vitals stable, but saturations low on RA.  Patient moans, and follows some commands, appears to have some weakness on left side.

## 2015-05-03 NOTE — ED Provider Notes (Signed)
By signing my name below, I, Mark Pena, attest that this documentation has been prepared under the direction and in the presence of Mark Maw Devonna Oboyle, DO Electronically Signed: Charlean Pena, ED Scribe 05/03/2015 at 4:22 AM.  TIME SEEN: 3:40 AM  CHIEF COMPLAINT:  Chief Complaint  Patient presents with  . Altered Mental Status   LEVEL 5 CAVEAT DUE TO DEMENTIA AND AMS  HPI:  HPI Comments: Mark Pena is a 82 y.o. male with a hx of vascular dementia, brought in by ambulance, who presents to the Emergency Department complaining for AMS this evening. Facility reports 1 episode coffee ground emesis and a fever of 100.4. Pt has had increased confusion in the past 2-3 weeks and trouble balancing. [name] for ports of progressive decline over the past several weeks. She reports that she was his primary caregiver and he is in the  [facility]ACE program.  She states doctors in this program were concerned about his decline and recommended he be placed in a facility. He states he has been in the [facility] facility for only several days. Wife called the [facility] facility earlier today when pt was walking around and talking normally. However by dinner time he was displaying abnormal activity and unresponsive per facility. Discussed with nursing home staff were unable to confirm a time last seen normal. Wife admits that she has not seen the patient in several days. Wife denies any known cough recently, head injury. States he is normally able to walk and talk but is often very confused.   ROS: See HPI - LEVEL 5 CAVEAT for altered mental status  PAST MEDICAL HISTORY/PAST SURGICAL HISTORY:  Past Medical History  Diagnosis Date  . Dementia   . Benign prostatic hyperplasia (BPH) with urinary urge incontinence   . Abdominal wall hernia     MEDICATIONS:  Prior to Admission medications   Not on File    ALLERGIES:  Not on File  SOCIAL HISTORY:  Social History  Substance Use Topics  . Smoking  status: Unknown If Ever Smoked  . Smokeless tobacco: Not on file  . Alcohol Use: No    FAMILY HISTORY: History reviewed. No pertinent family history.  EXAM: BP 137/79 mmHg  Pulse 104  Temp(Src) 100.1 F (37.8 C) (Rectal)  Resp 31  SpO2 95% CONSTITUTIONAL: Patient is very difficult to arouse, he will open his eyes and move his extremities to sternal rub, he will moan and has garbled speech HEAD: Normocephalic EYES: Conjunctivae clear, PERRL, patient mostly keeps his eyes closed ENT: normal nose; no rhinorrhea; moist mucous membranes; pharynx without lesions noted, patient is drooling out the right side of his mouth NECK: Supple, no meningismus, no LAD; no nuchal rigidity CARD: RRR; S1 and S2 appreciated; no murmurs, no clicks, no rubs, no gallops  RESP: Normal chest excursion without splinting or tachypnea; patient does have new onset hypoxia but is satting well on 2 L nasal cannula, rhonchorous breath sounds, wet cough, no wheezing or respiratory distress ABD/GI: Normal bowel sounds; non-distended; soft, non-tender, no rebound, no guarding, no peritoneal signs  BACK:  The back appears normal and is non-tender to palpation, there is no CVA tenderness EXT: Normal ROM in all joints; non-tender to palpation; no edema; normal capillary refill; no cyanosis, no calf tenderness or swelling    SKIN: Normal color for age and race; warm, no rash NEURO: Patient has decreased responsiveness, will open his eyes and move all his extremities with sternal rub, he is having a hard  time sitting up on his own, swallowing his own secretions; patient appears to have right-sided facial droop and has dysarthria which she reports is new   MEDICAL DECISION MAKING: Patient here with fever, altered mental status. Hemodynamically stable. Labs show no leukocytosis, normal lactate. Chest x-ray shows no infiltrate but I'm concerned for aspiration. Nursing home staff reports that they found him in bed with vomit in his  mouth and on his pillow. They state it looked like "coffee grounds". He is not on anticoagulation. Patient's wife is at bedside. She is very upset and having a difficult time making decisions for the patient. She is not sure if she would want the patient to be a full code at this time. She agrees on IV fluids, antibiotics and admission. At this time she would not want any invasive procedures including intubation for airway protection, lumbar puncture, central line. She understands that given his altered mental status, drooling, and thick cough with recent vomiting that he has a high risk for aspiration which could lead to respiratory failure and cardiac arrest.  Patient will need admission. I suspect clinically that he does have aspiration pneumonia. We'll also obtain a head CT. Given he has had a progressive decline and there is no clear last seen normal he is outside of any TPA window.  ED PROGRESS: Head CT shows no acute abnormality. Patient's wife has been updated. Discussed with Dr. Katrinka Pena who agrees on admission to step down bed. I will place holding orders per his request. Care transferred to hospitalist service.   EKG Interpretation  Date/Time:  Thursday May 03 2015 01:42:42 EST Ventricular Rate:  106 PR Interval:  158 QRS Duration: 136 QT Interval:  341 QTC Calculation: 453 R Axis:   0 Text Interpretation:  Sinus tachycardia Probable left atrial enlargement Left bundle branch block No significant change since last tracing Confirmed by Mark Kimberley,  DO, Mark Pena ([id number]) on 05/03/2015 2:07:02 AM         CRITICAL CARE Performed by: Raelyn Number   Total critical care time: 40 minutes  Critical care time was exclusive of separately billable procedures and treating other patients.  Critical care was necessary to treat or prevent imminent or life-threatening deterioration.  Critical care was time spent personally by me on the following activities: development of treatment plan with  patient and/or surrogate as well as nursing, discussions with consultants, evaluation of patient's response to treatment, examination of patient, obtaining history from patient or surrogate, ordering and performing treatments and interventions, ordering and review of laboratory studies, ordering and review of radiographic studies, pulse oximetry and re-evaluation of patient's condition.   I personally performed the services described in this documentation, which was scribed in my presence. The recorded information has been reviewed and is accurate.   Mark Maw Dereon Corkery, DO 05/03/15 [phone]

## 2015-05-03 NOTE — Progress Notes (Signed)
Attempted to call report to Daphane RN. Nurse is currently providing patient care and will return the call.

## 2015-05-03 NOTE — Care Management Note (Signed)
Case Management Note  Patient Details  Name: ALDER MURRI MRN: [id number] Date of Birth: February 21, 1931  Subjective/Objective:       Adm w encepalopathy,fever             Action/Plan:supp wife, in [facility] program, from [facility]   Expected Discharge Date:                  Expected Discharge Plan:  Skilled Nursing Facility  In-House Referral:  Clinical Social Work  Discharge planning Services     Post Acute Care Choice:    Choice offered to:     DME Arranged:    DME Agency:     HH Arranged:    HH Agency:     Status of Service:     Medicare Important Message Given:    Date Medicare IM Given:    Medicare IM give by:    Date Additional Medicare IM Given:    Additional Medicare Important Message give by:     If discussed at Long Length of Stay Meetings, dates discussed:    Additional Comments: ur review done, sw ref made  Hanley Hays, RN 05/03/2015, 8:36 AM

## 2015-05-03 NOTE — Evaluation (Signed)
Courtesy visit of [facility] PCP and home nurse.   Thank you for caring for Mr. Mark Pena.  We have been getting to know him as a new enrollee in the PACE program as of 04/01/15.  As you are now aware, he has severe dementia with a progressive course, diagnosed prior to PACE as vascular dementia by a geriatrician Dr. Ellis Savage in [place].  He has very minimal medical comorbidity, but his dementia with behaviors has been an active problem.  He has had a difficult transition to [facility]ACE; not understanding his new surroundings (which are probably overstimulating for him), and not always easy to redirect, sometimes very resistive and bordering on belligerent.  Prior to his very recent admission to [facility] on 04/30/15 for short-term medical  management to gain some sort of control over his behaviors (in an effort to help him stabilize enough that he could continue to live at home, which was his wife's desire - as opposed to placement), he had been awake several nights in succession, would get up and wander with unstable gait, and would urinate in inappropriate locations in the house (he has also done this at [facility]; urinating in drawers, trashcans, etc).  Admission to [facility] was as much to provide respite to his wife Mark Pena (she works full time as an Patent examiner) as it was to get a better handle on Mark Pena.  Memantine was recently started at 5 mg bid and was to be increased to 10 mg bid after one month. *Trial of donepezil prior to PACE resulted in exacerbation of urinary incontinence. Mirtazapine 7.5 mg nightly was prescribed on 04/23/15 and increased to 15 mg on 04/30/15. Risperidone 0.25 mg bid was prescribed on 04/25/15 with caution for unwanted sedation or worsened gait stability. Depakote 125 mg bid was started on 04/30/15. Haldol 0.5 mg po q6 hrs prn agitation not responsive to non-pharmacologic measures was written on 05/02/15, but [facility] has indicated that they had not given any doses. Our  nurse contacted [facility] this morning and was notified that [name] did not have any of his evening medicines last night due to excess sedation vs reduced responsiveness of an alternative cause.  Benjerman is somnolent, arouses though with eyes remaining closed, to speak/mumble few fragments of sentences which are illogical.  (He speaks quite clearly when his dentures are in on a good day, and will converse, though with illogical and tangential thought content).  Today I note his lungs are clear posteriorly; stage one pressure injury developing over upper buttucks/lower sacrum. Skin turgor is reduced.  This presentation may be a hypoactive delirium event precipitated by changes in environment and new medicines, albeit at low doses, if no other underlying cause is identified.  Thank you for ordering the brain imaging (I believe this was also done at [facility] if you need to locate films for comparison); he has some features which could be c/w NPH.  His B12 level and TSH were normal in past month. Of note, delirium improves more quickly in absence of tethers such as heart monitors, catheters, and restraints.   I have brought copies of my initial assessment (prior to enrollment at [facility]) as well as recent notes documenting some of the issues which have arisen related to declining cognition.  They were given to charge nurse.  Please call me if you would like to discuss background, ongoing issues, questions, or plans.  [facility] phone [phone], cell [phone] (call/text). I will be following along in EPIC.  Best regards, Charissa Bash, MD [facility]

## 2015-05-03 NOTE — ED Notes (Signed)
Patient transported to CT 

## 2015-05-04 ENCOUNTER — Inpatient Hospital Stay (HOSPITAL_COMMUNITY): Payer: Medicare (Managed Care)

## 2015-05-04 LAB — BASIC METABOLIC PANEL
ANION GAP: 10 (ref 5–15)
BUN: 23 mg/dL — ABNORMAL HIGH (ref 6–20)
CALCIUM: 8.3 mg/dL — AB (ref 8.9–10.3)
CO2: 21 mmol/L — ABNORMAL LOW (ref 22–32)
Chloride: 110 mmol/L (ref 101–111)
Creatinine, Ser: 1.08 mg/dL (ref 0.61–1.24)
Glucose, Bld: 90 mg/dL (ref 65–99)
POTASSIUM: 4.6 mmol/L (ref 3.5–5.1)
SODIUM: 141 mmol/L (ref 135–145)

## 2015-05-04 LAB — CBC WITH DIFFERENTIAL/PLATELET
Basophils Absolute: 0 10*3/uL (ref 0.0–0.1)
Basophils Relative: 0 %
EOS ABS: 0.1 10*3/uL (ref 0.0–0.7)
EOS PCT: 2 %
HCT: 41.8 % (ref 39.0–52.0)
Hemoglobin: 14 g/dL (ref 13.0–17.0)
LYMPHS ABS: 0.8 10*3/uL (ref 0.7–4.0)
LYMPHS PCT: 10 %
MCH: 31.7 pg (ref 26.0–34.0)
MCHC: 33.5 g/dL (ref 30.0–36.0)
MCV: 94.6 fL (ref 78.0–100.0)
MONO ABS: 0.7 10*3/uL (ref 0.1–1.0)
MONOS PCT: 9 %
NEUTROS ABS: 6.9 10*3/uL (ref 1.7–7.7)
NEUTROS PCT: 79 %
PLATELETS: 193 10*3/uL (ref 150–400)
RBC: 4.42 MIL/uL (ref 4.22–5.81)
RDW: 13.1 % (ref 11.5–15.5)
WBC: 8.7 10*3/uL (ref 4.0–10.5)

## 2015-05-04 LAB — HEPATIC FUNCTION PANEL
ALT: 14 U/L — ABNORMAL LOW (ref 17–63)
AST: 20 U/L (ref 15–41)
Albumin: 2.9 g/dL — ABNORMAL LOW (ref 3.5–5.0)
Alkaline Phosphatase: 65 U/L (ref 38–126)
BILIRUBIN DIRECT: 0.2 mg/dL (ref 0.1–0.5)
BILIRUBIN INDIRECT: 1 mg/dL — AB (ref 0.3–0.9)
TOTAL PROTEIN: 6 g/dL — AB (ref 6.5–8.1)
Total Bilirubin: 1.2 mg/dL (ref 0.3–1.2)

## 2015-05-04 LAB — URINE CULTURE: Culture: NO GROWTH

## 2015-05-04 MED ORDER — SODIUM CHLORIDE 0.9 % IV SOLN
INTRAVENOUS | Status: AC
  Administered 2015-05-04: 18:00:00 via INTRAVENOUS

## 2015-05-04 NOTE — Evaluation (Signed)
SLP Cancellation Note  Patient Details Name: CHE RACHAL MRN: [id number] DOB: 1930/06/26   Cancelled treatment:       Reason Eval/Treat Not Completed: Fatigue/lethargy limiting ability to participate  Pt stated " Stop it" with severely dysarthric speech with SLP stimulation.  He did not arouse adequately for po or eval.  Oral care indicated as mouth is full of dried secretions - advised RN.     Donavan Burnet, MS [facility] SLP [phone]

## 2015-05-04 NOTE — Evaluation (Signed)
Clinical/Bedside Swallow Evaluation Patient Details  Name: Mark Pena MRN: [id number] Date of Birth: 08/20/1930  Today's Date: 05/04/2015 Time: SLP Start Time (ACUTE ONLY): 1531 SLP Stop Time (ACUTE ONLY): 1542 SLP Time Calculation (min) (ACUTE ONLY): 11 min  Past Medical History:  Past Medical History  Diagnosis Date  . Dementia   . Benign prostatic hyperplasia (BPH) with urinary urge incontinence   . Abdominal wall hernia    Past Surgical History:  Past Surgical History  Procedure Laterality Date  . Cataract extraction     HPI:  82 y.o. male with h/o vascular dementia, presented to ED with c/o AMS and coffee ground emesis. Per MD note, pt admitted with diagnoses of acute encephalopathy, possible sepsis or reaction to recently initiation to new medications. CT Head 2/2 no acute change from 2016, advanced chrnoic small vessel disease. CXR 2/2 low volumes with basilar atelectasis.   Assessment / Plan / Recommendation Clinical Impression  Pt attempts to communicate although speech is mumbled and grossly unintelligible. He is lethargic but does respond to tactile and auditory stimuli despite keeping his eyes closed throughout most of session. He opens his mouth to accept ice chips and first presented trial is swallowed with weak hyolaryngeal movement to palpation. Additional ice simply melts in his mouth, with delayed, wet cough concerning for premature spillage into the airway. Recommend that pt remain NPO pending improved alertness. Will f/u to assess readiness.    Aspiration Risk  Severe aspiration risk    Diet Recommendation NPO   Medication Administration: Via alternative means    Other  Recommendations Oral Care Recommendations: Oral care QID   Follow up Recommendations  Skilled Nursing facility    Frequency and Duration min 2x/week  2 weeks       Prognosis Prognosis for Safe Diet Advancement: Fair Barriers to Reach Goals: Cognitive deficits;Other (Comment)  (lethargy)      Swallow Study   General HPI: 82 y.o. male with h/o vascular dementia, presented to ED with c/o AMS and coffee ground emesis. Per MD note, pt admitted with diagnoses of acute encephalopathy, possible sepsis or reaction to recently initiation to new medications. CT Head 2/2 no acute change from 2016, advanced chrnoic small vessel disease. CXR 2/2 low volumes with basilar atelectasis. Type of Study: Bedside Swallow Evaluation Previous Swallow Assessment: none found Diet Prior to this Study: NPO Temperature Spikes Noted: No Respiratory Status: Room air History of Recent Intubation: No Behavior/Cognition: Lethargic/Drowsy Oral Cavity Assessment: Dry Oral Care Completed by SLP: No Oral Cavity - Dentition: Edentulous;Other (Comment) (dentures present but not placed) Self-Feeding Abilities: Needs assist Patient Positioning: Upright in bed Baseline Vocal Quality: Low vocal intensity    Oral/Motor/Sensory Function Overall Oral Motor/Sensory Function:  (difficult to assess`)   Ice Chips Ice chips: Impaired Presentation: Spoon Oral Phase Impairments: Poor awareness of bolus Oral Phase Functional Implications: Oral holding Pharyngeal Phase Impairments: Suspected delayed Swallow;Cough - Delayed   Thin Liquid Thin Liquid: Not tested    Nectar Thick Nectar Thick Liquid: Not tested   Honey Thick Honey Thick Liquid: Not tested   Puree Puree: Not tested   Solid   GO   Solid: Not tested       Mark Pena, M.A. CCC-SLP [phone]  Mark Pena 05/04/2015,3:55 PM

## 2015-05-04 NOTE — Progress Notes (Addendum)
Interval History: Mark Pena is an 82 year old male with PMH of severe dementia admitted on 05/03/15 with acute encephalopathy.  Also with infectious signs and symptoms and reported hematemesis.  The patient was initially admitted by [facility] but has appropriately been transferred to [facility] since he is a [facility].    Review of [facility] notes reveals that he has severe late onset dementia with behavioral disturbance.  He seems to have progressed rapidly in the past two years.  He is new to [facility] in the past three months but was previously cared for by a geriatrician at [facility].  He has few medical problems, BPH, OA of the hands and ventral hernia.  In the past two weeks, mirtazapine, memantine and resiperdal have been added to his medication regimen.  His wife continues to have difficulty managing his behaviors at home so he was admitted to [facility] for short term medical management on 04/30/15.  Subjective: Mark Pena was seen and examined this AM.  He is minimally responsive.  Opens eyes and some simple verbal responses, such as "no, "ok."  Some responses are appropriate and makes sense, others do not make sense.  He says no to multiple questions about pain or discomfort.  He is able to tell me his name and birth year and that he is in the hospital but is not oriented to time.  He tells me his wife's name is Mark Pena and even spells it out for me correctly.  Objective: Vital signs in last 24 hours: Filed Vitals:   05/03/15 2140 05/04/15 0142 05/04/15 0201 05/04/15 0540  BP: 141/72 136/87 137/84 140/79  Pulse:  100 113 105  Temp:  98.8 F (37.1 C) 98.6 F (37 C) 98.6 F (37 C)  TempSrc:  Axillary Oral Oral  Resp:   22 20  Height:      Weight:      SpO2:  93% 96% 83%   Weight change:   Intake/Output Summary (Last 24 hours) at 05/04/15 0802 Last data filed at 05/04/15 0527  Gross per 24 hour  Intake 1072.5 ml  Output    250 ml  Net  822.5 ml   General: resting in bed in  NAD (HOB at 30 degrees) HEENT: [place]/AT Cardiac: RRR, no rubs, murmurs or gallops Pulm: coarse BS on left, moving normal volumes of air Abd: soft, nontender, nondistended, BS present Ext: warm and well perfused, no pedal edema, 2+ DPs  Neuro: alert and oriented to self and place, uncooperative with neuro exam due to AMS  Lab Results: Basic Metabolic Panel:  Recent Labs Lab 05/03/15 0305 05/04/15 0653  NA 141 141  K 4.9 4.6  CL 107 110  CO2 22 21*  GLUCOSE 120* 90  BUN 23* 23*  CREATININE 1.14 1.08  CALCIUM 8.9 8.3*   Liver Function Tests:  Recent Labs Lab 05/03/15 0305  AST 42*  ALT 16*  ALKPHOS 84  BILITOT 2.2*  PROT 6.7  ALBUMIN 3.3*    CBC:  Recent Labs Lab 05/03/15 0305  WBC 10.4  NEUTROABS 9.1*  HGB 15.2  HCT 44.0  MCV 92.8  PLT 236   Studies/Results: Mr Brain Wo Contrast  05/03/2015  CLINICAL DATA:  Acute encephalopathy. Increased confusion for the past 2-3 weeks. History of vascular dementia. EXAM: MRI HEAD WITHOUT CONTRAST TECHNIQUE: Multiplanar, multiecho pulse sequences of the brain and surrounding structures were obtained without intravenous contrast. COMPARISON:  Head CT 05/03/2015 FINDINGS: The study is mildly to moderately motion degraded.  There is no evidence of acute infarct, mass, midline shift, or extra-axial fluid collection. A chronic microhemorrhage is noted in the posterior right frontal lobe. Prominent lateral and moderate third ventricular enlargement is again noted. There is mild enlargement of the sylvian fissures and mild diffuse sulcal enlargement. Confluent T2 hyperintensities are present throughout the cerebral white matter bilaterally. Dilated perivascular spaces are noted throughout the basal ganglia. Small, chronic infarcts are noted in the right cerebellum and both thalami. Prior bilateral cataract extraction is noted. There is mild mucosal thickening throughout the paranasal sinuses. Mastoid air cells are clear. Major intracranial  vascular flow voids are preserved, with the left vertebral artery being dominant. IMPRESSION: 1. No acute intracranial abnormality. 2. Extensive chronic small vessel ischemic disease. 3. Chronic ventriculomegaly, favored to reflect cerebral atrophy though hydrocephalus is not excluded. Electronically Signed   By: Mark Pena M.D.   On: 05/03/2015 17:57   Dg Chest Port 1 View  05/04/2015  CLINICAL DATA:  Shortness of breath. EXAM: PORTABLE CHEST 1 VIEW COMPARISON:  05/03/2015. FINDINGS: Mediastinum and hilar structures normal. Low lung volumes with mild basilar atelectasis. Slight interim improvement. Cardiomegaly. No pleural effusion or pneumothorax. IMPRESSION: Low lung volumes with mild basilar atelectasis. Slight interim improvement. Electronically Signed   By: Mark Fus  Pena   On: 05/04/2015 07:33   Medications: I have reviewed the patient's current medications. Scheduled Meds: . divalproex  125 mg Oral BID  . enoxaparin (LOVENOX) injection  40 mg Subcutaneous Q24H  . finasteride  5 mg Oral Daily  . pantoprazole (PROTONIX) IV  40 mg Intravenous Q12H  . piperacillin-tazobactam (ZOSYN)  IV  3.375 g Intravenous 3 times per day  . risperiDONE  0.25 mg Oral QHS  . tamsulosin  0.4 mg Oral Daily   Continuous Infusions:  PRN Meds:.acetaminophen **OR** acetaminophen, albuterol, ondansetron **OR** ondansetron (ZOFRAN) IV Assessment/Plan: 82 year old male with severe dementia here with acute encephalopathy.  Acute encephalopathy:  I spoke with his wife and PCP, Dr. Charissa Pena ([facility]) regarding baseline.  He has confusion at baseline but can self feed and converse (can become tangential and sometimes does not make sense).  He has an unsteady gait and recent falls.  He is not normally somnolent.  I suspect Mark Pena's presentation may be a combination of new location (started going to [facility] 5 days per week last month, moved to [facility] four days ago) and perhaps new medications ADRs (started in  past two weeks).  MRI brain does not reveal acute CVA.  No evidence of infection or other metabolic cause.   - reorient as needed, minimize noise, open blinds in daylight, lights off in evening - STOP zosyn since there is no evidence of infection - STOP all CNS active medications for now - prn haldol if he is a harm to himself or others  - SLP evaluation for swallow eval - NS hydration  - vitals qshift or if change in status  Hematemesis:  None during admission.  Hgb remains normal. - monitor for recurrence - d/c protonix since there is no evidence of bleeding  Stage 1 pressure ulcer:  Small area of redness on left buttock.  No skin breakdown or sign of infection. - keep area dry and clean - wound care, frequent turns  BPH:  Okay to continue medications when he is po.  Diet:  NPO until he passes SLP eval; IV fluid hydration for now VTE ppx:  N[place] Lovenox Code:  DNR confirmed with wife.  Status changed  in chart from partial (DNI) to DNR.  Dispo: Disposition is deferred at this time, awaiting improvement of current medical problems.  Anticipated discharge in approximately 1-2 day(s).   The patient does not have a current PCP Mark Aschoff, MD) and does not need an [facility] hospital follow-up appointment after discharge.  The patient does not have transportation limitations that hinder transportation to clinic appointments.  .Services Needed at time of discharge: Y = Yes, Blank = No PT:   OT:   RN:   Equipment:   Other:     LOS: 1 day   Mark Manges, DO 05/04/2015, 8:02 AM

## 2015-05-05 DIAGNOSIS — L89321 Pressure ulcer of left buttock, stage 1: Secondary | ICD-10-CM

## 2015-05-05 DIAGNOSIS — F0151 Vascular dementia with behavioral disturbance: Secondary | ICD-10-CM

## 2015-05-05 DIAGNOSIS — N400 Enlarged prostate without lower urinary tract symptoms: Secondary | ICD-10-CM

## 2015-05-05 MED ORDER — SODIUM CHLORIDE 0.9 % IV SOLN
INTRAVENOUS | Status: DC
  Administered 2015-05-05: 07:00:00 via INTRAVENOUS

## 2015-05-05 MED ORDER — RISPERIDONE 0.5 MG PO TABS
0.5000 mg | ORAL_TABLET | Freq: Every day | ORAL | Status: DC
  Administered 2015-05-06: 0.5 mg via ORAL
  Filled 2015-05-05 (×2): qty 1

## 2015-05-05 NOTE — Progress Notes (Signed)
Pt attempting to get out of bed.  When trying to get him situated in bed pt became very combative.  MD made aware, awaiting new orders.  Will continue to monitor. Sondra Come, RN

## 2015-05-05 NOTE — Progress Notes (Signed)
Internal Medicine Attending  Date: 05/05/2015  Patient name: Mark Pena Medical record number: [id number] Date of birth: Aug 22, 1930 Age: 82 y.o. Gender: male  I saw and evaluated the patient. I reviewed the resident's note by Mark Pena and I agree with the resident's findings and plans as documented in his progress note.  Mark Pena was seen on rounds this morning. He was resting comfortably and calmly in bed. He was attached to IV fluids and had mitts on. He was able to answer some questions appropriately. At other times his speech was mumbling and unintelligible. We currently believe that his acute decompensation was likely related to a new environment in the setting of underlying vascular dementia as well as new pharmacologic agents that could affect his cognition. We will continue to hold the medications that were recently started. If he becomes agitated risperidone is an option. If an IM formulation were necessary we could consider a dose of Haldol. We will obtain a repeat swallowing study in hopes of being able to orally feed him and stop the IV fluids as well as administer his medications by mouth. If he is able to pass a swallowing study and his mental status is at baseline I anticipate he can be transferred back to [facility], or if desired by his wife, discharged to home.

## 2015-05-05 NOTE — Progress Notes (Signed)
Speech Language Pathology Treatment: Dysphagia  Patient Details Name: Mark Pena MRN: [id number] DOB: June 22, 1930 Today's Date: 05/05/2015 Time: 4098-1191 SLP Time Calculation (min) (ACUTE ONLY): 45 min  Assessment / Plan / Recommendation Clinical Impression  Wife present during dysphagia treatment to determine initiation of po's. Poor oral control and cohesion with thin liquids with premature spill of water likely into laryngeal vestibule followed by explosive cough. Small, therapist controlled sips of thin via straw were consistently consumed without overt difficulty. Prolonged mastication and mildly delayed transit with solid. Pt will require full assist and supervision with Dys 2 texture; recommending thin liquids via straw with staff/family to remove straw from oral cavity after 2 sips, meds whole in applesauce. Continued ST. SLP obtained PT/OT orders after wife reporting decreased balance and fall and questioning "how much longer she will be able to do this at home." Plan is for possible discharge back to [facility] (short term- was there for management of his medicines per wife).    HPI HPI: 82 y.o. male with h/o vascular dementia, presented to ED with c/o AMS and coffee ground emesis. Per MD note, pt admitted with diagnoses of acute encephalopathy, possible sepsis or reaction to recently initiation to new medications. CT Head 2/2 no acute change from 2016, advanced chrnoic small vessel disease. CXR 2/2 low volumes with basilar atelectasis.      SLP Plan  Continue with current plan of care     Recommendations  Diet recommendations: Dysphagia 2 (fine chop);Thin liquid Liquids provided via: Straw Medication Administration: Whole meds with puree Supervision: Full supervision/cueing for compensatory strategies;Patient able to self feed Compensations: Slow rate;Small sips/bites;Lingual sweep for clearance of pocketing Postural Changes and/or Swallow Maneuvers: Seated upright 90  degrees             Oral Care Recommendations: Oral care BID Follow up Recommendations: Skilled Nursing facility Plan: Continue with current plan of care     GO                Royce Macadamia 05/05/2015, 3:37 PM   Breck Coons Lonell Face.Ed [employer] [phone]

## 2015-05-05 NOTE — Progress Notes (Signed)
Subjective: Patient seen and examined this morning.  She was sleeping but easily aroused.  He was able to answer appropriately that he is in the hospital, knows his name and birth year.  He did not appear to be uncomfortable or in any type of distress.  Objective: Vital signs in last 24 hours: Filed Vitals:   05/04/15 2209 05/05/15 0101 05/05/15 0629 05/05/15 0937  BP: 130/74 140/75 122/75 149/76  Pulse: 92 92 78 74  Temp: 98.4 F (36.9 C) 99.4 F (37.4 C) 98 F (36.7 C) 98.4 F (36.9 C)  TempSrc: Oral Oral Oral Oral  Resp: Height:      Weight:      SpO2: 97% 98% 96% 94%   Weight change:  No intake or output data in the 24 hours ending 05/05/15 1013  General: sleeping in bed, arousable, no distress HEENT: normocephalic, atraumatic Cardiac: RRR Pulm: no increased work of breathing Abd: soft, nontender, nondistended, BS present Ext: warm and well perfused, no pedal edema, mittens on hands Neuro: alert and oriented to self and place but not time  Lab Results: Basic Metabolic Panel:  Recent Labs Lab 05/03/15 0305 05/04/15 0653  NA 141 141  K 4.9 4.6  CL 107 110  CO2 22 21*  GLUCOSE 120* 90  BUN 23* 23*  CREATININE 1.14 1.08  CALCIUM 8.9 8.3*   Liver Function Tests:  Recent Labs Lab 05/03/15 0305 05/04/15 1011  AST 42* 20  ALT 16* 14*  ALKPHOS 84 65  BILITOT 2.2* 1.2  PROT 6.7 6.0*  ALBUMIN 3.3* 2.9*    CBC:  Recent Labs Lab 05/03/15 0305 05/04/15 1011  WBC 10.4 8.7  NEUTROABS 9.1* 6.9  HGB 15.2 14.0  HCT 44.0 41.8  MCV 92.8 94.6  PLT 236 193   Studies/Results: Mr Brain Wo Contrast  05/03/2015  CLINICAL DATA:  Acute encephalopathy. Increased confusion for the past 2-3 weeks. History of vascular dementia. EXAM: MRI HEAD WITHOUT CONTRAST TECHNIQUE: Multiplanar, multiecho pulse sequences of the brain and surrounding structures were obtained without intravenous contrast. COMPARISON:  Head CT 05/03/2015 FINDINGS: The study is mildly to  moderately motion degraded. There is no evidence of acute infarct, mass, midline shift, or extra-axial fluid collection. A chronic microhemorrhage is noted in the posterior right frontal lobe. Prominent lateral and moderate third ventricular enlargement is again noted. There is mild enlargement of the sylvian fissures and mild diffuse sulcal enlargement. Confluent T2 hyperintensities are present throughout the cerebral white matter bilaterally. Dilated perivascular spaces are noted throughout the basal ganglia. Small, chronic infarcts are noted in the right cerebellum and both thalami. Prior bilateral cataract extraction is noted. There is mild mucosal thickening throughout the paranasal sinuses. Mastoid air cells are clear. Major intracranial vascular flow voids are preserved, with the left vertebral artery being dominant. IMPRESSION: 1. No acute intracranial abnormality. 2. Extensive chronic small vessel ischemic disease. 3. Chronic ventriculomegaly, favored to reflect cerebral atrophy though hydrocephalus is not excluded. Electronically Signed   By: Sebastian Ache M.D.   On: 05/03/2015 17:57   Dg Chest Port 1 View  05/04/2015  CLINICAL DATA:  Shortness of breath. EXAM: PORTABLE CHEST 1 VIEW COMPARISON:  05/03/2015. FINDINGS: Mediastinum and hilar structures normal. Low lung volumes with mild basilar atelectasis. Slight interim improvement. Cardiomegaly. No pleural effusion or pneumothorax. IMPRESSION: Low lung volumes with mild basilar atelectasis. Slight interim improvement. Electronically Signed   By: Maisie Fus  Register   On: 05/04/2015 07:33   Medications: I  have reviewed the patient's current medications. Scheduled Meds: . enoxaparin (LOVENOX) injection  40 mg Subcutaneous Q24H  . finasteride  5 mg Oral Daily  . tamsulosin  0.4 mg Oral Daily   Continuous Infusions: . sodium chloride 75 mL/hr at 05/05/15 0657   PRN Meds:.acetaminophen **OR** acetaminophen, albuterol, ondansetron **OR** ondansetron  (ZOFRAN) IV Assessment/Plan: 82 year old male with severe dementia here with acute encephalopathy.  Hypoactive Delirium:  Patient appears to be waking up a little more this more and seems more alert.  When speaking with his wife yesterday afternoon, she states that at his baseline he has confusion but feeds himself (sometimes needs assistance cutting food, reminding that food is present) and is able to have conversation (can become tangential and sometimes does not make sense).  Patients presentation likely hypoactive delirium precipitated by changes in environment and new medications.  B12 and TSH normal within past month, MRI and CT brain did not reveal acute CVA.  BMP and CBC both unremarkable for metabolic or infectious cause.  SLP evaluated yesterday afternoon and recommended NPO with medication administration via alternate means.  Antibiotics have been stopped as well as all CNS acting medications - reorient as needed, minimize noise, open blinds in daylight, lights off in evening - NS IV fluids today.  If NPO tomorrow, would consider D5 with q4h CBG checks - if patient becomes agitated would consider low-dose Risperidone - follow up SLP recommendations  Stage 1 Pressure Ulcer:  Small area of redness on left buttock.  No skin breakdown or sign of infection. - keep area dry and clean - wound care, frequent turns  BPH:  On Finasteride at home - holding while NPO but okay to continue once able to take PO meds  Diet:  NPO until he passes SLP eval; IV fluid hydration for now  DVT PPx:  Lovenox  Code:  DNR confirmed with wife.  Dispo: Disposition is deferred at this time, awaiting improvement of current medical problems.  Anticipated discharge in approximately 1-2 day(s) back to SNF.  CSW consulted  The patient does not have a current PCP Miguel Aschoff, MD) and does not need an [facility] hospital follow-up appointment after discharge.  The patient does not have transportation limitations  that hinder transportation to clinic appointments.  .Services Needed at time of discharge: Y = Yes, Blank = No PT:   OT:   RN:   Equipment:   Other:     LOS: 2 days   Gwynn Burly, DO 05/05/2015, 10:13 AM

## 2015-05-06 LAB — BASIC METABOLIC PANEL
ANION GAP: 12 (ref 5–15)
BUN: 18 mg/dL (ref 6–20)
CALCIUM: 8.7 mg/dL — AB (ref 8.9–10.3)
CHLORIDE: 105 mmol/L (ref 101–111)
CO2: 24 mmol/L (ref 22–32)
CREATININE: 0.79 mg/dL (ref 0.61–1.24)
GFR calc non Af Amer: >60 mL/min (ref >60)
Glucose, Bld: 93 mg/dL (ref 65–99)
Potassium: 3.7 mmol/L (ref 3.5–5.1)
SODIUM: 141 mmol/L (ref 135–145)

## 2015-05-06 LAB — CBC
HCT: 41.6 % (ref 39.0–52.0)
HEMOGLOBIN: 14.6 g/dL (ref 13.0–17.0)
MCH: 32 pg (ref 26.0–34.0)
MCHC: 35.1 g/dL (ref 30.0–36.0)
MCV: 91.2 fL (ref 78.0–100.0)
Platelets: 394 10*3/uL (ref 150–400)
RBC: 4.56 MIL/uL (ref 4.22–5.81)
RDW: 14.1 % (ref 11.5–15.5)
WBC: 5.8 10*3/uL (ref 4.0–10.5)

## 2015-05-06 NOTE — Evaluation (Signed)
Physical Therapy Evaluation Patient Details Name: Mark Pena MRN: [id number] DOB: [birth year] Today's Date: 05/06/2015   History of Present Illness  Mr. Burgo is an 82 year old male with PMH of severe dementia admitted on 05/03/15 with acute encephalopathy. Also with infectious signs and symptoms and reported hematemesis.  Clinical Impression  Pt admitted with the above complications. Pt currently with functional limitations due to the deficits listed below (see PT Problem List). Requires max facilitatory cues and assistance to stand. Tolerated upright standing with min assist <30 seconds on 2 occasions. Difficulty mobilizing in bed and also requires assistance. 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 Appropriate for return to skilled rehab facility to continue functional mobility training. Pt will benefit from skilled PT to increase their independence and safety with mobility to allow discharge to the venue listed below.       Follow Up Recommendations SNF;Supervision/Assistance - 24 hour    Equipment Recommendations  None recommended by PT    Recommendations for Other Services       Precautions / Restrictions Precautions Precautions: Fall Restrictions Weight Bearing Restrictions: No      Mobility  Bed Mobility Overal bed mobility: Needs Assistance Bed Mobility: Supine to Sit     Supine to sit: Mod assist;HOB elevated     General bed mobility comments: Mod assist for facilitation of LEs to EOB and to pull up through PTs hand with additional support for trunk with significant posterior lean. Max VC throughout with use of bed pad to scoot to EOB.  Transfers Overall transfer level: Needs assistance Equipment used:  (Holding back of chair) Transfers: Sit to/from Stand Sit to Stand: Max assist         General transfer comment: Max assist for boost with Max verbal cues and tactile cues to rise. Once upright pt required min assist for  balance with lean towards Rt side. Hands on back of chair for support. Facilatory cues for upright posture. No buckling but unable to lift feet from floor. Performed x2 from lowest bed setting.  Ambulation/Gait             General Gait Details: Unable to safely take steps  Stairs            Wheelchair Mobility    Modified Rankin (Stroke Patients Only)       Balance Overall balance assessment: Needs assistance Sitting-balance support: Single extremity supported;Feet supported Sitting balance-Leahy Scale: Poor Sitting balance - Comments: Tolerated sitting EOB approx 10 mintues focusing on midline hold, leaning heavily to Rt and posterior. Reaching for objects to correct posture. Hand over hand assist for holding chair and bed for support. Postural control: Posterior lean;Right lateral lean Standing balance support: Bilateral upper extremity supported Standing balance-Leahy Scale: Poor Standing balance comment: Rt lateral lean. tolerated <30 second stand each attempt.                             Pertinent Vitals/Pain Pain Assessment:  (PAIDAD 0/10)    Home Living Family/patient expects to be discharged to:: Unsure Living Arrangements: Spouse/significant other Available Help at Discharge: Family;Skilled Nursing Facility             Additional Comments: Patient is a poor historian. No family immediately available to confirm information. From notes, pt from rehab facility and a [facility]eber of [facility].    Prior Function Level of Independence: Needs assistance   Gait / Transfers  Assistance Needed: Patient states that he is able to ambulate independently however no family available to confirm.     Comments: Patient from SNF per notes. Unable to provide full or accurate history with current cognitive state. No family immediately available for answering.     Hand Dominance        Extremity/Trunk Assessment   Upper Extremity Assessment: Defer to OT  evaluation           Lower Extremity Assessment: Difficult to assess due to impaired cognition         Communication   Communication: No difficulties  Cognition Arousal/Alertness: Awake/alert Behavior During Therapy: Impulsive Overall Cognitive Status: No family/caregiver present to determine baseline cognitive functioning Area of Impairment: Following commands;Problem solving       Following Commands: Follows one step commands inconsistently;Follows one step commands with increased time     Problem Solving: Slow processing;Decreased initiation;Difficulty sequencing;Requires verbal cues;Requires tactile cues      General Comments General comments (skin integrity, edema, etc.): Jokingly asked for a beer when asked if anything can make him more comfortable.    Exercises        Assessment/Plan    PT Assessment Patient needs continued PT services  PT Diagnosis Difficulty walking;Generalized weakness;Altered mental status   PT Problem List Decreased strength;Decreased activity tolerance;Decreased balance;Decreased mobility;Decreased cognition;Decreased knowledge of use of DME;Decreased safety awareness  PT Treatment Interventions DME instruction;Gait training;Functional mobility training;Therapeutic activities;Therapeutic exercise;Balance training;Neuromuscular re-education;Cognitive remediation;Patient/family education   PT Goals (Current goals can be found in the Care Plan section) Acute Rehab PT Goals Patient Stated Goal: none stated PT Goal Formulation: Patient unable to participate in goal setting Time For Goal Achievement: 05/20/15 Potential to Achieve Goals: Fair    Frequency Min 2X/week   Barriers to discharge        Co-evaluation               End of Session Equipment Utilized During Treatment: Gait belt Activity Tolerance: Other (comment) (Limited by poor cognition) Patient left: in bed;with call bell/phone within reach;with bed alarm set Nurse  Communication: Mobility status         Time: 1610-9604 PT Time Calculation (min) (ACUTE ONLY): 19 min   Charges:   PT Evaluation $PT Eval Moderate Complexity: 1 Procedure     PT G CodesBerton Mount 05/06/2015, 12:22 PM  Sunday Spillers [place],  [phone]

## 2015-05-06 NOTE — Progress Notes (Signed)
Internal Medicine Attending  Date: 05/06/2015  Patient name: Mark Pena Medical record number: [id number] Date of birth: [birth year] Age: 82 y.o. Gender: male  I saw and evaluated the patient. I reviewed the resident's note by Dr. Allena Katz and I agree with the resident's findings and plans as documented in his progress note.  When [name] was seen on rounds this morning he was more alert than the day before. He was able to recall what he had for breakfast which included eggs and sausage. We will attempt to provide him with Risperdal at night to see if we can control the sundowning and agitation that he experienced last night. Overall, it appears that his excessive somnolence was related, at least in part, to overmedication and we are now attempting to find the balance of controlling his aggressive and combative behavior without overly sedating him. We appreciate physical therapy's and speech therapy's assistance with his care.

## 2015-05-06 NOTE — Progress Notes (Signed)
PT Cancellation Note  Patient Details Name: FISHEL WAMBLE MRN: [id number] DOB: 1930-10-19   Cancelled Treatment:    Reason Eval/Treat Not Completed: Patient not medically ready Currently with strict bed rest order. Will follow-up when medically ready for PT evaluation. Weekend teaching Service paged.  Berton Mount 05/06/2015, 9:30 AM  Charlsie Merles, PT [phone]

## 2015-05-06 NOTE — Progress Notes (Signed)
Speech Language Pathology Treatment: Dysphagia  Patient Details Name: Mark Pena MRN: [id number] DOB: [birth year] Today's Date: 05/06/2015 Time: 5643-3295 SLP Time Calculation (min) (ACUTE ONLY): 15 min  Assessment / Plan / Recommendation Clinical Impression  ST follow up for therapeutic diet tolerance at requesting of nursing due to observed issues this AM with his breakfast tray.  Chart was reviewed and the patient was noted to have a few low grade temps since initiation of the diet but that his lungs were clear but diminished.  Intake has been poor per nursing.  Meal observation was completed with the patient's lunch tray.  The patient was noted to talk with food in his mouth indicating poor awareness of the bolus.  Pocketing in the right cheek was noted for dysphagia 2 textures as well as oral holding across textures.  Mild oral stasis noted given minced and pureed material once material was swallowed.  Given straws sips of thins that were presented one at a time with the straw being removed between each sip the patient appeared to do well.  Suspect a delay in the patient's swallow trigger.  Cough x 1 was noted during entire meal observation.  Recommend downgrading the patient's diet to dysphagia 1 with thin liquids.  Aspiration precautions as follows:  Sit totally upright, feed slowly, cue patient to swallow, check mouth for pocketing every few bites, alternate food and liquids, allow patient to use a straw to drink thin liquids giving ONE SIP at a time, removing the straw from the patient's mouth between sips.  ST will follow closely for therapeutic diet tolerance.     HPI HPI: 82 y.o. male with h/o vascular dementia, presented to ED with c/o AMS and coffee ground emesis. Per MD note, pt admitted with diagnoses of acute encephalopathy, possible sepsis or reaction to recently initiation to new medications. CT Head 2/2 no acute change from 2016, advanced chrnoic small vessel disease. CXR 2/2 low  volumes with basilar atelectasis.      SLP Plan  Continue with current plan of care     Recommendations  Diet recommendations: Dysphagia 1 (puree);Thin liquid Liquids provided via: Straw Medication Administration: Crushed with puree Supervision: Trained caregiver to feed patient Compensations: Slow rate;Small sips/bites (check mouth for pocketing, feed slowly) Postural Changes and/or Swallow Maneuvers: Seated upright 90 degrees             Oral Care Recommendations: Oral care BID Follow up Recommendations: Skilled Nursing facility Plan: Continue with current plan of care     GO               Dimas Aguas, MA, CCC-SLP Acute Rehab SLP [phone] Fleet Contras 05/06/2015, 12:57 PM

## 2015-05-06 NOTE — Progress Notes (Signed)
Subjective: Yesterday, he attempted to get out of bed. Risperdal was restarted though he was unable to take it last night as he was refusing medication. This morning, his mumbling of  words made it difficult to understand what he was trying to say that he recalled growing up with "eggs and sausage" as he look forward to eating breakfast this morning. .  Objective: Vital signs in last 24 hours: Filed Vitals:   05/06/15 0527 05/06/15 0535 05/06/15 0930 05/06/15 1319  BP: 141/108 132/63 132/62 160/78  Pulse: 86 80 80 87  Temp: 98.2 F (36.8 C) 99.1 F (37.3 C) 97.9 F (36.6 C) 98.9 F (37.2 C)  TempSrc: Oral Oral Axillary Axillary  Resp: Height:      Weight:      SpO2: 93% 96% 94% 97%   Weight change:  No intake or output data in the 24 hours ending 05/06/15 1434  General: sleeping in bed, arousable, no distress, more able to open eyes then yesterday, oriented to name and birth year but not place HEENT: normocephalic, atraumatic Cardiac: RRR Pulm: no increased work of breathing Abd: soft, nontender, nondistended, BS present Ext: warm and well perfused, no pedal edema, mittens on hands Neuro: Able to open eyes on command  Lab Results: Basic Metabolic Panel:  Recent Labs Lab 05/04/15 0653 05/06/15 0300  NA 141 141  K 4.6 3.7  CL 110 105  CO2 21* 24  GLUCOSE 90 93  BUN 23* 18  CREATININE 1.08 0.79  CALCIUM 8.3* 8.7*   Liver Function Tests:  Recent Labs Lab 05/03/15 0305 05/04/15 1011  AST 42* 20  ALT 16* 14*  ALKPHOS 84 65  BILITOT 2.2* 1.2  PROT 6.7 6.0*  ALBUMIN 3.3* 2.9*    CBC:  Recent Labs Lab 05/03/15 0305 05/04/15 1011 05/06/15 0228  WBC 10.4 8.7 5.8  NEUTROABS 9.1* 6.9  --   HGB 15.2 14.0 14.6  HCT 44.0 41.8 41.6  MCV 92.8 94.6 91.2  PLT 236 193 394   Studies/Results: Mr Brain Wo Contrast  05/03/2015  CLINICAL DATA:  Acute encephalopathy. Increased confusion for the past 2-3 weeks. History of vascular dementia. EXAM: MRI  HEAD WITHOUT CONTRAST TECHNIQUE: Multiplanar, multiecho pulse sequences of the brain and surrounding structures were obtained without intravenous contrast. COMPARISON:  Head CT 05/03/2015 FINDINGS: The study is mildly to moderately motion degraded. There is no evidence of acute infarct, mass, midline shift, or extra-axial fluid collection. A chronic microhemorrhage is noted in the posterior right frontal lobe. Prominent lateral and moderate third ventricular enlargement is again noted. There is mild enlargement of the sylvian fissures and mild diffuse sulcal enlargement. Confluent T2 hyperintensities are present throughout the cerebral white matter bilaterally. Dilated perivascular spaces are noted throughout the basal ganglia. Small, chronic infarcts are noted in the right cerebellum and both thalami. Prior bilateral cataract extraction is noted. There is mild mucosal thickening throughout the paranasal sinuses. Mastoid air cells are clear. Major intracranial vascular flow voids are preserved, with the left vertebral artery being dominant. IMPRESSION: 1. No acute intracranial abnormality. 2. Extensive chronic small vessel ischemic disease. 3. Chronic ventriculomegaly, favored to reflect cerebral atrophy though hydrocephalus is not excluded. Electronically Signed   By: Sebastian Ache M.D.   On: 05/03/2015 17:57   Dg Chest Port 1 View  05/04/2015  CLINICAL DATA:  Shortness of breath. EXAM: PORTABLE CHEST 1 VIEW COMPARISON:  05/03/2015. FINDINGS: Mediastinum and hilar structures normal. Low lung volumes with mild basilar  atelectasis. Slight interim improvement. Cardiomegaly. No pleural effusion or pneumothorax. IMPRESSION: Low lung volumes with mild basilar atelectasis. Slight interim improvement. Electronically Signed   By: Maisie Fus  Register   On: 05/04/2015 07:33   Medications: I have reviewed the patient's current medications. Scheduled Meds: . enoxaparin (LOVENOX) injection  40 mg Subcutaneous Q24H  .  finasteride  5 mg Oral Daily  . risperiDONE  0.5 mg Oral QHS  . tamsulosin  0.4 mg Oral Daily   Continuous Infusions:   PRN Meds:.acetaminophen **OR** acetaminophen, albuterol, ondansetron **OR** ondansetron (ZOFRAN) IV Assessment/Plan: 82 year old male with severe dementia here with acute encephalopathy with signs of improvement.  Acute encephalopathy:  Likely polypharmacy given that he was started on several medications with sedative side effects prior to admission. Today, he continues to show improvement and is able to open his eyes more than he did yesterday. Per his wife, at baseline he has confusion but feeds himself (sometimes needs assistance cutting food, reminding that food is present) and is able to have conversation (can become tangential and sometimes does not make sense).   -Continue conservative measures to reorient the patient minimizing noise, opening blinds during the daylight, avoiding disturbances during the evening to encourage sleep -Continue dysphagia 1 diet per SLP recommendations -Continue Risperdal 0.5 mg at bedtime and see how he does today on this medication alone  Stage 1 Pressure Ulcer:  Small area of redness on left buttock.  No skin breakdown or sign of infection. -Keep area dry and clean -Continue wound care, frequent turns  BPH:  On Finasteride at home - holding while NPO but okay to continue once able to take PO meds  Diet:  Dysphagia 1 Diet  DVT PPx:  Lovenox  Code:  DNR confirmed with wife.  Dispo: Disposition is deferred at this time, awaiting improvement of current medical problems.  Anticipated discharge in approximately 1-2 day(s) back to SNF.  CSW consulted  The patient does not have a current PCP Miguel Aschoff, MD) and does not need an [facility] hospital follow-up appointment after discharge.  The patient does not have transportation limitations that hinder transportation to clinic appointments.  .Services Needed at time of discharge: Y =  Yes, Blank = No PT:   OT:   RN:   Equipment:   Other:     LOS: 3 days   Beather Arbour, MD 05/06/2015, 2:34 PM

## 2015-05-07 ENCOUNTER — Encounter (HOSPITAL_COMMUNITY): Payer: Self-pay | Admitting: *Deleted

## 2015-05-07 MED ORDER — RISPERIDONE 0.5 MG PO TABS: 0.5000 mg | ORAL_TABLET | Freq: Every day | ORAL | Status: DC

## 2015-05-07 NOTE — Progress Notes (Signed)
PT Cancellation Note  Patient Details Name: Mark Pena MRN: [id number] DOB: 1930/11/18   Cancelled Treatment:    Reason Eval/Treat Not Completed: Patient leaving for procedure per nursing. Will continue to follow as able.    Christiane Ha, PT, CSCS Pager [phone] Office [phone]  05/07/2015, 2:38 PM

## 2015-05-07 NOTE — Progress Notes (Signed)
Speech Language Pathology Treatment: Dysphagia  Patient Details Name: Mark Pena MRN: [id number] DOB: 05/24/1930 Today's Date: 05/07/2015 Time: 2130-8657 SLP Time Calculation (min) (ACUTE ONLY): 15 min  Assessment / Plan / Recommendation Clinical Impression  ST follow up for therapeutic diet tolerance.  Nursing reported no issues taking his medications crushed in puree this AM.  Inspection of the patient's oral cavity revealed what appeared to be particles of breakfast on his tongue.  This material was removed prior to meal observation.  Chart review indicated that the patient has been afebrile and lungs are clear but diminished.  The patient remains confused with difficulty following commands.  Meal observation was completed using dysphagia 1 and thin liquids.  Physical and verbal cues were required to facilitate consistent movement of the bolus orally.  The patient at times tended to hold materially orally.  No overt s/s of aspiration were observed.  The patient was noted to have mild lingual stasis and even when consistently manipulating the material was very slow orally.  Given this upgraded trials of solids were not attempted.  The patient continued to perform well with liquids presented via straws sips taken one at a time.  Recommend continue with a dysphagia 1 diet with thin liquids with aspiration precautions.  Pt requires full assistance for all meals.  Strongly encourage oral care following any intake to remove any oral stasis.  Nurse was informed of this new recommendation.  ST will continue to follow.        HPI HPI: 82 y.o. male with h/o vascular dementia, presented to ED with c/o AMS and coffee ground emesis. Per MD note, pt admitted with diagnoses of acute encephalopathy, possible sepsis or reaction to recently initiation to new medications. CT Head 2/2 no acute change from 2016, advanced chrnoic small vessel disease. CXR 2/2 low volumes with basilar atelectasis.      SLP Plan  Continue with current plan of care     Recommendations  Diet recommendations: Dysphagia 1 (puree);Thin liquid Liquids provided via: Straw Medication Administration: Crushed with puree Supervision: Trained caregiver to feed patient Compensations: Slow rate;Small sips/bites Postural Changes and/or Swallow Maneuvers: Seated upright 90 degrees             Plan: Continue with current plan of care     GO               Dimas Aguas, MA, CCC-SLP Acute Rehab SLP [phone] Fleet Contras 05/07/2015, 12:32 PM

## 2015-05-07 NOTE — Discharge Summary (Signed)
Name: Mark Pena MRN: [id number] DOB: [birth year] 82 y.o. PCP: Mark Aschoff, MD  Date of Admission: 05/03/2015  1:35 AM Date of Discharge: 05/07/2015 Attending Physician: Gardiner Barefoot, MD  Discharge Diagnosis:  Principal Problem:   Acute encephalopathy Active Problems:   Fever   SIRS (systemic inflammatory response syndrome) (HCC)   Hematemesis   Acute kidney injury superimposed on chronic kidney disease (HCC)   Vascular dementia   Pressure ulcer  Discharge Medications:   Medication List    STOP taking these medications        divalproex 125 MG capsule  Commonly known as:  DEPAKOTE SPRINKLE     haloperidol 0.5 MG tablet  Commonly known as:  HALDOL     mirtazapine 15 MG tablet  Commonly known as:  REMERON      TAKE these medications        finasteride 5 MG tablet  Commonly known as:  PROSCAR  Take 5 mg by mouth daily.     risperiDONE 0.5 MG tablet  Commonly known as:  RISPERDAL  Take 1 tablet (0.5 mg total) by mouth at bedtime.     tamsulosin 0.4 MG Caps capsule  Commonly known as:  FLOMAX  Take 0.4 mg by mouth daily.     Vitamin D (Ergocalciferol) 50000 units Caps capsule  Commonly known as:  DRISDOL  Take 50,000 Units by mouth every 30 (thirty) days.        Disposition and follow-up:   Mark Pena was discharged from [facility] in Stable condition.    1.  At the hospital follow up visit please address:  - patients medications: only medication restarted at discharge was Risperidone 0.5mg  QHS.  Other medications recently started and/or titrated up were held due to concern for polypharmacy and new environment contributing to his encephalopathy/hypoactive delirium.  Can add back medications as needed - keep an eye on his stage 1 pressure ulcer.  Wound care as follows: barrier cream to buttocks, apply with each turning and cleaning episode - continue dysphagia 1 diet per SLP evaluation and recommendations. -  continue physical therapy at discharge to SNF  2.  Labs / imaging needed at time of follow-up: none  3.  Pending labs/ test needing follow-up: none  Follow-up Appointments:   Discharge Instructions:   Consultations:    Procedures Performed:  Dg Chest 2 View  05/03/2015  CLINICAL DATA:  Altered mental status. EXAM: CHEST  2 VIEW COMPARISON:  11/04/2014 FINDINGS: Low volumes with streaky basilar opacity. Chronic cardiomegaly. Vascular pedicle widening which is greater than prior, but may be related to rightward rotation. No edema, effusion, or air leak. IMPRESSION: Low volumes with basilar atelectasis. Electronically Signed   By: Marnee Spring M.D.   On: 05/03/2015 02:32   Ct Head Wo Contrast  05/03/2015  CLINICAL DATA:  Coffee-ground emesis with altered mental status and left-sided weakness. EXAM: CT HEAD WITHOUT CONTRAST TECHNIQUE: Contiguous axial images were obtained from the base of the skull through the vertex without intravenous contrast. COMPARISON:  11/04/2014 FINDINGS: Skull and Sinuses:Negative for fracture or destructive process. Chronic sinusitis with diffuse mucosal thickening. Visualized orbits: Bilateral cataract resection. Brain: Advanced chronic small vessel disease with ischemic gliosis confluent throughout the bilateral cerebral white matter and present in the pons. Indistinct and low-density basal ganglia on a chronic basis, likely ischemic gliosis and dilated perivascular spaces from hypertension. Chronic bilateral thalamic lacunar infarcts. Chronic small vessel infarct in the peripheral right cerebellum. Chronic  ventriculomegaly, favor atrophy over chronic communicating hydrocephalus. IMPRESSION: 1. No acute finding or change from 2016. 2. Advanced chronic small vessel disease. Electronically Signed   By: Marnee Spring M.D.   On: 05/03/2015 04:56   Mr Brain Wo Contrast  05/03/2015  CLINICAL DATA:  Acute encephalopathy. Increased confusion for the past 2-3 weeks. History of  vascular dementia. EXAM: MRI HEAD WITHOUT CONTRAST TECHNIQUE: Multiplanar, multiecho pulse sequences of the brain and surrounding structures were obtained without intravenous contrast. COMPARISON:  Head CT 05/03/2015 FINDINGS: The study is mildly to moderately motion degraded. There is no evidence of acute infarct, mass, midline shift, or extra-axial fluid collection. A chronic microhemorrhage is noted in the posterior right frontal lobe. Prominent lateral and moderate third ventricular enlargement is again noted. There is mild enlargement of the sylvian fissures and mild diffuse sulcal enlargement. Confluent T2 hyperintensities are present throughout the cerebral white matter bilaterally. Dilated perivascular spaces are noted throughout the basal ganglia. Small, chronic infarcts are noted in the right cerebellum and both thalami. Prior bilateral cataract extraction is noted. There is mild mucosal thickening throughout the paranasal sinuses. Mastoid air cells are clear. Major intracranial vascular flow voids are preserved, with the left vertebral artery being dominant. IMPRESSION: 1. No acute intracranial abnormality. 2. Extensive chronic small vessel ischemic disease. 3. Chronic ventriculomegaly, favored to reflect cerebral atrophy though hydrocephalus is not excluded. Electronically Signed   By: Sebastian Ache M.D.   On: 05/03/2015 17:57   Dg Chest Port 1 View  05/04/2015  CLINICAL DATA:  Shortness of breath. EXAM: PORTABLE CHEST 1 VIEW COMPARISON:  05/03/2015. FINDINGS: Mediastinum and hilar structures normal. Low lung volumes with mild basilar atelectasis. Slight interim improvement. Cardiomegaly. No pleural effusion or pneumothorax. IMPRESSION: Low lung volumes with mild basilar atelectasis. Slight interim improvement. Electronically Signed   By: Maisie Fus  Register   On: 05/04/2015 07:33    2D Echo: none  Cardiac Cath: none  Admission HPI:  Mark Pena is a 82 year old male with a past medical history of  vascular dementia and BPH; presents from [facility] nursing facility with complaints of acute altered mental status and thought to have vomited coffee ground. Patient's history is obtained from his wife as he is acutely altered and in not responding to verbal commands.   Patient wife states that he had been living at home and going to the pace program during the day until approximately 3 days ago when they recommended the patient to go into [facility] nursing facility for medication adjustments. Wife noted that he had been becoming more confused lately and needing more care. Reporting increased sundowning from the history of vascular dementia. She notes that he would be up multiple times throughout the night and had just last fallen 5 days ago. Patient was able to get back up without problem as she states he usually does. Wife notes that she was called around 12:45 AM this morning and told that the patient was responsive and coughing up coffee ground material thought to be blood. She she was told by the facility that he had eaten his lunch as normal. She notes that prior to joining the pace program. Previously he had only been on on vitamin, finasteride, and tamulosin. After he joined the pace program she states he was placed on risperidone and mirtazapineand has been on this for approximately a week or so. She states she's not sure but she thinks Depakote and Haldol were added at [facility] nursing facility. They state his last  dose of medication was Monday, but records are not available. Patient was noted to have a fever 100.4 at the nursing facility. At baseline the patient is able to feed himself, hold normal conversation, and walk without the assistance of a cane. She notes she is unsure if patient has a new facial droop as he does not have his dentures and cannot be sure.   Hospital Course by problem list: Principal Problem:   Acute encephalopathy Active Problems:   Fever   SIRS (systemic  inflammatory response syndrome) (HCC)   Hematemesis   Acute kidney injury superimposed on chronic kidney disease (HCC)   Vascular dementia   Pressure ulcer   Acute Encephalopathy: Resolved. Likely polypharmacy given that he was started on several medications with sedative side effects prior to admission. On day of discharge, he continues to show improvement and is able to open his eyes on command. Per his wife, at baseline he has confusion but feeds himself (sometimes needs assistance cutting food, reminding that food is present) and is able to have conversation (can become tangential and sometimes does not make sense). Continue conservative measures to reorient the patient minimizing noise, opening blinds during the daylight, avoiding disturbances during the evening to encourage sleep.  Continue dysphagia 1 diet per SLP recommendations.  Continue Risperdal 0.5 mg at bedtime. Will defer restarting other medications to PCP and SNF at discharge.  Continue physical therapy at SNF.  Stage 1 Pressure Ulcer: Small area of redness on left buttock. No skin breakdown or sign of infection.  Keep area dry and clean.  Continue wound care, frequent turns  BPH: On Finasteride and Tamsulosin at home, resumed.  Discharge Vitals:   BP 145/80 mmHg  Pulse 73  Temp(Src) 98.3 F (36.8 C) (Oral)  Resp 16  Ht 6' (1.829 m)  Wt 194 lb 14.2 oz (88.4 kg)  BMI 26.43 kg/m2  SpO2 73%  Discharge Labs:  No results found for this or any previous visit (from the past 24 hour(s)).  Signed: Gwynn Burly, DO 05/07/2015, 10:31 AM    Services Ordered on Discharge: SNF Equipment Ordered on Discharge: none

## 2015-05-07 NOTE — NC FL2 (Signed)
MEDICAID FL2 LEVEL OF CARE SCREENING TOOL     IDENTIFICATION  Patient Name: Mark Pena Birthdate: Jul 03, 1930 Sex: male Admission Date (Current Location): 05/03/2015  [facility] and [place] Number:  Producer, television/film/video and Address:  [facility], [address] [place], [place] [address]      Provider Number: [id number]  Attending Physician Name and Address:  Gardiner Barefoot, MD  Relative Name and Phone Number:       Current Level of Care: Hospital Recommended Level of Care: Skilled Nursing Facility Prior Approval Number:    Date Approved/Denied:   PASRR Number:   [id number]   Discharge Plan: SNF    Current Diagnoses: Patient Active Problem List   Diagnosis Date Noted  . Fever 05/03/2015  . Acute encephalopathy 05/03/2015  . SIRS (systemic inflammatory response syndrome) (HCC) 05/03/2015  . Hematemesis 05/03/2015  . Acute kidney injury superimposed on chronic kidney disease (HCC) 05/03/2015  . Vascular dementia 05/03/2015  . Pressure ulcer 05/03/2015    Orientation RESPIRATION BLADDER Height & Weight     Self  Normal Incontinent Weight: 194 lb 14.2 oz (88.4 kg) Height:  6' (182.9 cm)  BEHAVIORAL SYMPTOMS/MOOD NEUROLOGICAL BOWEL NUTRITION STATUS   (NONE )  (NONE ) Incontinent Diet (DYS 1)  AMBULATORY STATUS COMMUNICATION OF NEEDS Skin   Limited Assist Verbally PU Stage and Appropriate Care                       Personal Care Assistance Level of Assistance  Bathing, Dressing Bathing Assistance: Limited assistance   Dressing Assistance: Limited assistance     Functional Limitations Info   (NONE )          SPECIAL CARE FACTORS FREQUENCY  PT (By licensed PT)     PT Frequency: 2              Contractures      Additional Factors Info  Code Status, Allergies Code Status Info: DNR CODE  Allergies Info: N/A           Current Medications (05/07/2015):  This is the current hospital active medication  list Current Facility-Administered Medications  Medication Dose Route Frequency Provider Last Rate Last Dose  . acetaminophen (TYLENOL) tablet 650 mg  650 mg Oral Q6H PRN Clydie Braun, MD       Or  . acetaminophen (TYLENOL) suppository 650 mg  650 mg Rectal Q6H PRN Rondell A Katrinka Blazing, MD      . albuterol (PROVENTIL) (2.5 MG/3ML) 0.083% nebulizer solution 2.5 mg  2.5 mg Nebulization Q2H PRN Rondell A Katrinka Blazing, MD      . enoxaparin (LOVENOX) injection 40 mg  40 mg Subcutaneous Q24H Rondell Burtis Junes, MD   40 mg at 05/06/15 1709  . finasteride (PROSCAR) tablet 5 mg  5 mg Oral Daily Maretta Bees, MD   5 mg at 05/07/15 1012  . ondansetron (ZOFRAN) tablet 4 mg  4 mg Oral Q6H PRN Clydie Braun, MD       Or  . ondansetron (ZOFRAN) injection 4 mg  4 mg Intravenous Q6H PRN Clydie Braun, MD      . risperiDONE (RISPERDAL) tablet 0.5 mg  0.5 mg Oral QHS Deneise Lever, MD   0.5 mg at 05/06/15 2341  . tamsulosin (FLOMAX) capsule 0.4 mg  0.4 mg Oral Daily Maretta Bees, MD   0.4 mg at 05/07/15 1012     Discharge Medications:  Please see discharge summary for a list of discharge medications.  Relevant Imaging Results:  Relevant Lab Results:   Additional Information SSN [id number]  Vaughan Browner, LCSW

## 2015-05-07 NOTE — Progress Notes (Signed)
Subjective: Mark Pena was sleeping comfortably in his room this morning and was easily aroused.  He states, "I think I'm feeling well."  Objective: Vital signs in last 24 hours: Filed Vitals:   05/06/15 1712 05/06/15 2201 05/07/15 0211 05/07/15 0530  BP: 154/76 153/96 127/93 147/78  Pulse: 80 90 85 84  Temp: 98 F (36.7 C) 98.5 F (36.9 C) 98.1 F (36.7 C) 98.3 F (36.8 C)  TempSrc: Oral Oral Oral Oral  Resp: Height:      Weight:      SpO2: 96% 90% 96% 97%   Weight change:  No intake or output data in the 24 hours ending 05/07/15 0956  General: sleeping in bed, arousable, no distress HEENT: normocephalic, atraumatic Cardiac: RRR Pulm: no increased work of breathing Abd: soft, nontender, nondistended, BS present Ext: warm and well perfused, no pedal edema, mittens on hands Neuro: Able to open eyes on command  Lab Results: Basic Metabolic Panel:  Recent Labs Lab 05/04/15 0653 05/06/15 0300  NA 141 141  K 4.6 3.7  CL 110 105  CO2 21* 24  GLUCOSE 90 93  BUN 23* 18  CREATININE 1.08 0.79  CALCIUM 8.3* 8.7*   Liver Function Tests:  Recent Labs Lab 05/03/15 0305 05/04/15 1011  AST 42* 20  ALT 16* 14*  ALKPHOS 84 65  BILITOT 2.2* 1.2  PROT 6.7 6.0*  ALBUMIN 3.3* 2.9*    CBC:  Recent Labs Lab 05/03/15 0305 05/04/15 1011 05/06/15 0228  WBC 10.4 8.7 5.8  NEUTROABS 9.1* 6.9  --   HGB 15.2 14.0 14.6  HCT 44.0 41.8 41.6  MCV 92.8 94.6 91.2  PLT 236 193 394   Studies/Results: Mr Brain Wo Contrast  05/03/2015  CLINICAL DATA:  Acute encephalopathy. Increased confusion for the past 2-3 weeks. History of vascular dementia. EXAM: MRI HEAD WITHOUT CONTRAST TECHNIQUE: Multiplanar, multiecho pulse sequences of the brain and surrounding structures were obtained without intravenous contrast. COMPARISON:  Head CT 05/03/2015 FINDINGS: The study is mildly to moderately motion degraded. There is no evidence of acute infarct, mass, midline shift, or  extra-axial fluid collection. A chronic microhemorrhage is noted in the posterior right frontal lobe. Prominent lateral and moderate third ventricular enlargement is again noted. There is mild enlargement of the sylvian fissures and mild diffuse sulcal enlargement. Confluent T2 hyperintensities are present throughout the cerebral white matter bilaterally. Dilated perivascular spaces are noted throughout the basal ganglia. Small, chronic infarcts are noted in the right cerebellum and both thalami. Prior bilateral cataract extraction is noted. There is mild mucosal thickening throughout the paranasal sinuses. Mastoid air cells are clear. Major intracranial vascular flow voids are preserved, with the left vertebral artery being dominant. IMPRESSION: 1. No acute intracranial abnormality. 2. Extensive chronic small vessel ischemic disease. 3. Chronic ventriculomegaly, favored to reflect cerebral atrophy though hydrocephalus is not excluded. Electronically Signed   By: Sebastian Ache M.D.   On: 05/03/2015 17:57   Dg Chest Port 1 View  05/04/2015  CLINICAL DATA:  Shortness of breath. EXAM: PORTABLE CHEST 1 VIEW COMPARISON:  05/03/2015. FINDINGS: Mediastinum and hilar structures normal. Low lung volumes with mild basilar atelectasis. Slight interim improvement. Cardiomegaly. No pleural effusion or pneumothorax. IMPRESSION: Low lung volumes with mild basilar atelectasis. Slight interim improvement. Electronically Signed   By: Maisie Fus  Register   On: 05/04/2015 07:33   Medications: I have reviewed the patient's current medications. Scheduled Meds: . enoxaparin (LOVENOX) injection  40 mg Subcutaneous Q24H  .  finasteride  5 mg Oral Daily  . risperiDONE  0.5 mg Oral QHS  . tamsulosin  0.4 mg Oral Daily   Continuous Infusions:   PRN Meds:.acetaminophen **OR** acetaminophen, albuterol, ondansetron **OR** ondansetron (ZOFRAN) IV Assessment/Plan: 82 year old male with severe dementia here with acute encephalopathy with  signs of improvement.  Acute Encephalopathy: Resolved.  Likely polypharmacy given that he was started on several medications with sedative side effects prior to admission. Today, he continues to show improvement and is able to open his eyes on command. Per his wife, at baseline he has confusion but feeds himself (sometimes needs assistance cutting food, reminding that food is present) and is able to have conversation (can become tangential and sometimes does not make sense).   -Continue conservative measures to reorient the patient minimizing noise, opening blinds during the daylight, avoiding disturbances during the evening to encourage sleep -Continue dysphagia 1 diet per SLP recommendations -Continue Risperdal 0.5 mg at bedtime.  Will defer restarting other medications to PCP and SNF at discharge.  Stage 1 Pressure Ulcer:  Small area of redness on left buttock.  No skin breakdown or sign of infection. -Keep area dry and clean -Continue wound care, frequent turns  BPH:  On Finasteride and Tamsulosin at home - resumed  Diet:  Dysphagia 1 Diet  DVT PPx:  Lovenox  Code:  DNR confirmed with wife.  Dispo: Discharge home to SNF today  The patient does not have a current PCP Mark Aschoff, MD) and does not need an [facility] hospital follow-up appointment after discharge.  The patient does not have transportation limitations that hinder transportation to clinic appointments.  .Services Needed at time of discharge: Y = Yes, Blank = No PT:  yes  OT:   RN:   Equipment:   Other:  SNF    LOS: 4 days   Gwynn Burly, DO 05/07/2015, 9:56 AM

## 2015-05-07 NOTE — Clinical Social Work Note (Signed)
Clinical Child psychotherapist received phone call from [employer], [place] indicating patient is from SNF, [facility]. Pt's wife has expressed interest in [facility]. P[facility] is closer to pt's home. After several calls to [facility], facility has declined patient. Wife and PACE program notified.   Wife is agreeable to patient returning to SNF, [facility]. Wife pleasant and appreciated social work intervention. No further concerns reported by family at this time.   Clinical Social Worker facilitated patient discharge including contacting patient family and facility to confirm patient discharge plans.  Clinical information faxed to facility and family agreeable with plan.  CSW arranged ambulance transport via [facility] to [facility].  RN to call report prior to discharge.  Clinical Social Worker will sign off for now as social work intervention is no longer needed. Please consult [place] again if new need arises.  [name], MSW, LCSWA [phone] 05/07/2015 1:49 PM

## 2015-05-07 NOTE — Progress Notes (Signed)
Patient is discharged from room 5M15 at this time. Alert and in stable condition. IV site d/c'd. Report called in to nurse Starleen Arms at [facility]. Patient transported out of unit via stretcher by [facility] with all belongings at side.

## 2015-05-08 LAB — CULTURE, BLOOD (ROUTINE X 2)
CULTURE: NO GROWTH
CULTURE: NO GROWTH

## 2015-10-04 ENCOUNTER — Inpatient Hospital Stay (HOSPITAL_COMMUNITY)
Admission: EM | Admit: 2015-10-04 | Discharge: 2015-10-10 | DRG: 087 | Disposition: A | Discharge status: Skilled Nursing Facility | Payer: Medicare (Managed Care) | Attending: Internal Medicine | Admitting: Internal Medicine

## 2015-10-04 ENCOUNTER — Encounter (HOSPITAL_COMMUNITY): Payer: Self-pay | Admitting: Emergency Medicine

## 2015-10-04 ENCOUNTER — Emergency Department (HOSPITAL_COMMUNITY): Payer: Medicare (Managed Care)

## 2015-10-04 DIAGNOSIS — IMO0002 Reserved for concepts with insufficient information to code with codable children: Secondary | ICD-10-CM

## 2015-10-04 DIAGNOSIS — K439 Ventral hernia without obstruction or gangrene: Secondary | ICD-10-CM | POA: Diagnosis present

## 2015-10-04 DIAGNOSIS — Z79899 Other long term (current) drug therapy: Secondary | ICD-10-CM

## 2015-10-04 DIAGNOSIS — F0151 Vascular dementia with behavioral disturbance: Secondary | ICD-10-CM | POA: Diagnosis not present

## 2015-10-04 DIAGNOSIS — W19XXXA Unspecified fall, initial encounter: Secondary | ICD-10-CM

## 2015-10-04 DIAGNOSIS — S06340A Traumatic hemorrhage of right cerebrum without loss of consciousness, initial encounter: Principal | ICD-10-CM | POA: Diagnosis present

## 2015-10-04 DIAGNOSIS — Y9289 Other specified places as the place of occurrence of the external cause: Secondary | ICD-10-CM | POA: Diagnosis not present

## 2015-10-04 DIAGNOSIS — F0150 Vascular dementia without behavioral disturbance: Secondary | ICD-10-CM | POA: Diagnosis not present

## 2015-10-04 DIAGNOSIS — N3941 Urge incontinence: Secondary | ICD-10-CM | POA: Diagnosis present

## 2015-10-04 DIAGNOSIS — Y9389 Activity, other specified: Secondary | ICD-10-CM | POA: Diagnosis not present

## 2015-10-04 DIAGNOSIS — Z66 Do not resuscitate: Secondary | ICD-10-CM | POA: Diagnosis not present

## 2015-10-04 DIAGNOSIS — I615 Nontraumatic intracerebral hemorrhage, intraventricular: Secondary | ICD-10-CM | POA: Diagnosis not present

## 2015-10-04 DIAGNOSIS — S0101XA Laceration without foreign body of scalp, initial encounter: Secondary | ICD-10-CM | POA: Diagnosis not present

## 2015-10-04 DIAGNOSIS — R296 Repeated falls: Secondary | ICD-10-CM | POA: Diagnosis present

## 2015-10-04 DIAGNOSIS — N400 Enlarged prostate without lower urinary tract symptoms: Secondary | ICD-10-CM

## 2015-10-04 DIAGNOSIS — W01190A Fall on same level from slipping, tripping and stumbling with subsequent striking against furniture, initial encounter: Secondary | ICD-10-CM | POA: Diagnosis not present

## 2015-10-04 DIAGNOSIS — I619 Nontraumatic intracerebral hemorrhage, unspecified: Secondary | ICD-10-CM

## 2015-10-04 LAB — CBC WITH DIFFERENTIAL/PLATELET
Basophils Absolute: 0 10*3/uL (ref 0.0–0.1)
Basophils Relative: 0 %
EOS PCT: 2 %
Eosinophils Absolute: 0.2 10*3/uL (ref 0.0–0.7)
HCT: 40.8 % (ref 39.0–52.0)
Hemoglobin: 13.8 g/dL (ref 13.0–17.0)
LYMPHS PCT: 15 %
Lymphs Abs: 1.1 10*3/uL (ref 0.7–4.0)
MCH: 31.9 pg (ref 26.0–34.0)
MCHC: 33.8 g/dL (ref 30.0–36.0)
MCV: 94.4 fL (ref 78.0–100.0)
Monocytes Absolute: 0.8 10*3/uL (ref 0.1–1.0)
Monocytes Relative: 10 %
Neutro Abs: 5.7 10*3/uL (ref 1.7–7.7)
Neutrophils Relative %: 73 %
PLATELETS: 220 10*3/uL (ref 150–400)
RBC: 4.32 MIL/uL (ref 4.22–5.81)
RDW: 13.2 % (ref 11.5–15.5)
WBC: 7.8 10*3/uL (ref 4.0–10.5)

## 2015-10-04 LAB — COMPREHENSIVE METABOLIC PANEL
ALBUMIN: 4 g/dL (ref 3.5–5.0)
ALK PHOS: 76 U/L (ref 38–126)
ALT: 13 U/L — ABNORMAL LOW (ref 17–63)
ANION GAP: 5 (ref 5–15)
AST: 17 U/L (ref 15–41)
BUN: 20 mg/dL (ref 6–20)
CHLORIDE: 107 mmol/L (ref 101–111)
CO2: 28 mmol/L (ref 22–32)
Calcium: 8.9 mg/dL (ref 8.9–10.3)
Creatinine, Ser: 1.05 mg/dL (ref 0.61–1.24)
GFR calc non Af Amer: >60 mL/min (ref >60)
GLUCOSE: 98 mg/dL (ref 65–99)
POTASSIUM: 4.3 mmol/L (ref 3.5–5.1)
SODIUM: 140 mmol/L (ref 135–145)
Total Bilirubin: 0.7 mg/dL (ref 0.3–1.2)
Total Protein: 6.6 g/dL (ref 6.5–8.1)

## 2015-10-04 LAB — PROTIME-INR
INR: 1.01 (ref 0.00–1.49)
PROTHROMBIN TIME: 13.5 s (ref 11.6–15.2)

## 2015-10-04 MED ORDER — LIDOCAINE-EPINEPHRINE 2 %-1:100000 IJ SOLN
20.0000 mL | Freq: Once | INTRAMUSCULAR | Status: AC
  Administered 2015-10-04: 20 mL via INTRADERMAL
  Filled 2015-10-04: qty 1

## 2015-10-04 MED ORDER — TETANUS-DIPHTH-ACELL PERTUSSIS 5-2.5-18.5 LF-MCG/0.5 IM SUSP
0.5000 mL | Freq: Once | INTRAMUSCULAR | Status: AC
  Administered 2015-10-04: 0.5 mL via INTRAMUSCULAR
  Filled 2015-10-04: qty 0.5

## 2015-10-04 NOTE — ED Provider Notes (Signed)
CSN: [id number]     Arrival date & time 10/04/15  1756 History   First MD Initiated Contact with Patient 10/04/15 1905     Chief Complaint  Patient presents with  . Fall  . Head Injury     (Consider location/radiation/quality/duration/timing/severity/associated sxs/prior Treatment) HPI Mark Pena is a 82 y.o. male twice a day EMS [facility] with PMH significant for Dementia who presents with head injury after a fall sustained approximately 5:15 PM this evening. Nursing staff report that the patient was walking and seemed to lose his footing causing him to fall backwards striking his head against the corner of the piano. No LOC. He is not anticoagulated. Staff reports he has been acting at his baseline. Deny any chest pain, syncope, or other complaints.  No modifying factors. Tetanus unknown.  Past Medical History  Diagnosis Date  . Dementia   . Benign prostatic hyperplasia (BPH) with urinary urge incontinence   . Abdominal wall hernia    Past Surgical History  Procedure Laterality Date  . Cataract extraction     No family history on file. Social History  Substance Use Topics  . Smoking status: Unknown If Ever Smoked  . Smokeless tobacco: None     Comment: Patient not a good historian at this time.  . Alcohol Use: No    Review of Systems  Unable to perform ROS: Dementia      Allergies  Review of patient's allergies indicates no known allergies.  Home Medications   Prior to Admission medications   Medication Sig Start Date End Date Taking? Authorizing Provider  finasteride (PROSCAR) 5 MG tablet Take 5 mg by mouth daily.    Historical Provider, MD  risperiDONE (RISPERDAL) 0.5 MG tablet Take 1 tablet (0.5 mg total) by mouth at bedtime. 05/07/15   Gwynn BurlyAndrew Wallace, DO  tamsulosin (FLOMAX) 0.4 MG CAPS capsule Take 0.4 mg by mouth daily.    Historical Provider, MD  Vitamin D, Ergocalciferol, (DRISDOL) 50000 units CAPS capsule Take 50,000 Units by mouth  every 30 (thirty) days.    Historical Provider, MD   BP 152/72 mmHg  Pulse 77  Temp(Src) 98.5 F (36.9 C) (Oral)  Resp 17  SpO2 100% Physical Exam  Constitutional: He appears well-developed and well-nourished.  Non-toxic appearance. He does not have a sickly appearance. He does not appear ill.  HENT:  Head: Normocephalic.  Mouth/Throat: Oropharynx is clear and moist.  3-4 cm laceration to occiput.  Bleeding controlled.  No FBs seen or palpated.   Eyes: Conjunctivae are normal.  Neck: Normal range of motion. Neck supple.  No cervical midline tenderness.   Cardiovascular: Normal rate and regular rhythm.   Pulmonary/Chest: Effort normal and breath sounds normal. No accessory muscle usage or stridor. No respiratory distress. He has no wheezes. He has no rhonchi. He has no rales.  Abdominal: Soft. Bowel sounds are normal. He exhibits no distension. There is no tenderness. There is no rebound and no guarding.  Musculoskeletal: Normal range of motion.  Lymphadenopathy:    He has no cervical adenopathy.  Neurological: He is alert. GCS eye subscore is 4. GCS verbal subscore is 4. GCS motor subscore is 6.  Speech clear without dysarthria.  Skin: Skin is warm and dry.  No other signs of trauma.   Psychiatric: He has a normal mood and affect. His behavior is normal.    ED Course  Procedures (including critical care time)  LACERATION REPAIR Performed by: Cheri FowlerKayla Carolena Fairbank Consent: Verbal  consent obtained. Risks and benefits: risks, benefits and alternatives were discussed Patient identity confirmed: provided demographic data Time out performed prior to procedure Prepped and Draped in normal sterile fashion Wound explored Laceration Location: left occiput Laceration Length: 3-4cm No Foreign Bodies seen or palpated Anesthesia: local infiltration Local anesthetic: lidocaine 2% with epinephrine Anesthetic total: 5 ml Irrigation method: syringe Amount of cleaning: standard Skin closure:  staple Number of sutures or staples: 4 Technique: staples Patient tolerance: Patient tolerated the procedure well with no immediate complications.  Labs Review Labs Reviewed  COMPREHENSIVE METABOLIC PANEL - Abnormal; Notable for the following:    ALT 13 (*)    All other components within normal limits  CBC WITH DIFFERENTIAL/PLATELET  PROTIME-INR    Imaging Review Ct Head Wo Contrast  10/04/2015  CLINICAL DATA:  Witnessed fall at 1715 hours at skilled nursing facility, stumbled and fell backwards striking posterior head on corner can out in day room, no loss of consciousness, not on blood thinners, posterior head laceration, history dementia EXAM: CT HEAD WITHOUT CONTRAST CT CERVICAL SPINE WITHOUT CONTRAST TECHNIQUE: Multidetector CT imaging of the head and cervical spine was performed following the standard protocol without intravenous contrast. Multiplanar CT image reconstructions of the cervical spine were also generated. COMPARISON:  None. CT head 05/03/2015 FINDINGS: CT HEAD FINDINGS Generalized atrophy. Diffuse dilatation of the ventricular system, chronic. No midline shift or mass effect. Extensive small vessel chronic ischemic changes of deep cerebral white matter. Intraventricular hemorrhage identified at the dilated atrium and occipital horn of the RIGHT lateral ventricle. No intraparenchymal hemorrhage or extra-axial fluid collections. No evidence of mass or acute infarction. Probable old thalamic lacunar infarcts bilaterally. LEFT parieto-occipital scalp hematoma. Calvaria intact. Scattered mucosal thickening in the ethmoid air cells. Remaining paranasal sinuses and mastoid air cells clear. CT CERVICAL SPINE FINDINGS Prevertebral soft tissues normal thickness. Multilevel facet degenerative changes. Scattered mild disc space narrowing and endplate spur formation greatest at C6-C7. Minimal anterolisthesis C5-C6 likely due to degenerative disc and facet disease. No acute fracture, additional  subluxation, or bone destruction. Visualized skullbase intact. Tips of lung apices clear. IMPRESSION: Atrophy with small vessel chronic ischemic changes of deep cerebral white matter. Intraventricular hemorrhage within the RIGHT lateral ventricle at the occipital horn and atrium. Probable old lacunar infarcts at the thalamus bilaterally. No intraparenchymal hemorrhage or acute infarct identified. Multilevel degenerative disc and facet disease changes cervical spine. No acute cervical spine abnormalities. Findings called to Cheri FowlerKayla Emmanuelle Coxe PA on 10/04/2015 at 2011 hours. Electronically Signed   By: Ulyses SouthwardMark  Boles M.D.   On: 10/04/2015 20:12   Ct Cervical Spine Wo Contrast  10/04/2015  CLINICAL DATA:  Witnessed fall at 1715 hours at skilled nursing facility, stumbled and fell backwards striking posterior head on corner can out in day room, no loss of consciousness, not on blood thinners, posterior head laceration, history dementia EXAM: CT HEAD WITHOUT CONTRAST CT CERVICAL SPINE WITHOUT CONTRAST TECHNIQUE: Multidetector CT imaging of the head and cervical spine was performed following the standard protocol without intravenous contrast. Multiplanar CT image reconstructions of the cervical spine were also generated. COMPARISON:  None. CT head 05/03/2015 FINDINGS: CT HEAD FINDINGS Generalized atrophy. Diffuse dilatation of the ventricular system, chronic. No midline shift or mass effect. Extensive small vessel chronic ischemic changes of deep cerebral white matter. Intraventricular hemorrhage identified at the dilated atrium and occipital horn of the RIGHT lateral ventricle. No intraparenchymal hemorrhage or extra-axial fluid collections. No evidence of mass or acute infarction. Probable old thalamic lacunar infarcts  bilaterally. LEFT parieto-occipital scalp hematoma. Calvaria intact. Scattered mucosal thickening in the ethmoid air cells. Remaining paranasal sinuses and mastoid air cells clear. CT CERVICAL SPINE FINDINGS  Prevertebral soft tissues normal thickness. Multilevel facet degenerative changes. Scattered mild disc space narrowing and endplate spur formation greatest at C6-C7. Minimal anterolisthesis C5-C6 likely due to degenerative disc and facet disease. No acute fracture, additional subluxation, or bone destruction. Visualized skullbase intact. Tips of lung apices clear. IMPRESSION: Atrophy with small vessel chronic ischemic changes of deep cerebral white matter. Intraventricular hemorrhage within the RIGHT lateral ventricle at the occipital horn and atrium. Probable old lacunar infarcts at the thalamus bilaterally. No intraparenchymal hemorrhage or acute infarct identified. Multilevel degenerative disc and facet disease changes cervical spine. No acute cervical spine abnormalities. Findings called to Cheri Fowler PA on 10/04/2015 at 2011 hours. Electronically Signed   By: Ulyses Southward M.D.   On: 10/04/2015 20:12   I have personally reviewed and evaluated these images and lab results as part of my medical decision-making.   EKG Interpretation None      MDM   Final diagnoses:  Fall, initial encounter  Laceration  Intraventricular hemorrhage (HCC)   Patient presents from nursing home after witnessed mechanical fall. Patient with 3-4 cm laceration to occiput. Tetanus unknown. Mental status at baseline. CT showed intraventricular hemorrhage. CT cervical spine negative for acute abnormalities. Spoke with Dr. Jeral Fruit with neurosurgery who recommends repeat head CT in 24 hours as he is not a surgical candidate and transfer to [facility], medical admission. Labs obtained for admission and are without acute abnormality. Laceration repaired with staples. Patient is DO NOT RESUSCITATE. Patient admitted to internal medicine teaching service. EMTALA completed.     Cheri Fowler, PA-C 10/04/15 2239  Arby Barrette, MD 10/05/15 [phone]

## 2015-10-04 NOTE — ED Notes (Signed)
[facility] called for transport. 

## 2015-10-04 NOTE — ED Notes (Signed)
PA at bedside.

## 2015-10-04 NOTE — ED Notes (Signed)
Pt returned from CT °

## 2015-10-04 NOTE — ED Notes (Signed)
Pt transported to CT ?

## 2015-10-04 NOTE — ED Notes (Signed)
Report called to nurse on 67M

## 2015-10-04 NOTE — ED Notes (Signed)
Bed: [facility] Expected date:  Expected time:  Means of arrival:  Comments: EMS Fall 

## 2015-10-04 NOTE — ED Notes (Signed)
Per EMS [facility] around 515pm that was witnessed by SNF staff. Patient stumbled then fell backwards hitting posterior head on corner of piano in day room.  Staff denied LOC or patient taking any blood thinners.  Patient does have laceration to posterior head which bleeding is controled at this time.

## 2015-10-04 NOTE — H&P (Signed)
Date: 10/04/2015               Patient Name:  Mark Pena MRN: [id number]  DOB: 05/09/1930 Age / Sex: 82 y.o., male   PCP: Miguel AschoffJulie Anne Williams, MD         Medical Service: Internal Medicine Teaching Service         Attending Physician: Dr. Burns SpainElizabeth A Butcher, MD    First Contact: Dr. Antony ContrasGuilloud Pager: [phone]  Second Contact: Dr. Isabella BowensKrall Pager: [phone]       After Hours (After 5p/  First Contact Pager: [phone]  weekends / holidays): Second Contact Pager: [phone]   Chief Complaint: closed head injury, fall  History of Present Illness:  History obtained from chart review due to dementia.  Mark Pena is an 82 year old man with a history of vascular dementia and multiple falls who suffered a fall with head injury but no loss of consciousness this afternoon (7/6).  He is not on blood thinners and did not lose consciousness.  This afternoon at approximately 5:15 pm, Mark Pena fell backwards while walking.  He struck the back of his head on the corner of a piano.  According to facility staff who witnessed the fall, he appeared to lose his balance, did not lose consciousness, and his mental status was at baseline following the fall.  [facility] was [facility] ED by EMS, where a head CT showed right intraventricular hemorrhage.  A scalp laceration on his occiput was repaired.  His documented GCS was 14, losing points only for confused/disoriented speech. Per ED provider, neurosurgery recommended repeat head CT in 24 hours.  Meds: Current Facility-Administered Medications  Medication Dose Route Frequency Provider Last Rate Last Dose  . acetaminophen (TYLENOL) tablet 650 mg  650 mg Oral Q6H PRN Tasrif Ahmed, MD       Or  . acetaminophen (TYLENOL) suppository 650 mg  650 mg Rectal Q6H PRN Tasrif Ahmed, MD      . citalopram (CELEXA) tablet 20 mg  20 mg Oral Daily Tasrif Ahmed, MD      . feeding supplement (BOOST / RESOURCE BREEZE) liquid 1 Container  1 Container Oral BID  BM Tasrif Ahmed, MD      . finasteride (PROSCAR) tablet 5 mg  5 mg Oral Daily Tasrif Ahmed, MD      . risperiDONE (RISPERDAL) tablet 0.25 mg  0.25 mg Oral QHS Tasrif Ahmed, MD   0.25 mg at 10/05/15 0123  . tamsulosin (FLOMAX) capsule 0.4 mg  0.4 mg Oral Daily Tasrif Ahmed, MD        Allergies: Allergies as of 10/04/2015  . (No Known Allergies)   Past Medical History  Diagnosis Date  . Dementia   . Benign prostatic hyperplasia (BPH) with urinary urge incontinence   . Abdominal wall hernia     Family History: Unable to obtain due to dementia.  Social History: Unable to obtain due to dementia.  Review of Systems: No current headache, double vision, chest pain, abdominal pain.  Unable to obtain to obtain complete ROS due to dementia.  Physical Exam: Blood pressure 149/70, pulse 64, temperature 98.6 F (37 C), temperature source Oral, resp. rate 16, SpO2 96 %.  Physical Exam  Constitutional: He appears well-developed and well-nourished.  HENT:  Vertical scalp laceration ~4 cm closed with staples.  No deformations of the skull, no Battle's sign.  Edentulous.  Eyes: Conjunctivae are normal. Pupils are equal, round, and reactive to light.  Unable to assess EOM due to lack of patient participation.  Neck:  No midline tenderness, supple  Cardiovascular: Normal rate, regular rhythm, normal heart sounds and intact distal pulses.   Pulmonary/Chest: Effort normal and breath sounds normal. No respiratory distress. He has no wheezes. He has no rales. He exhibits no tenderness.  Abdominal: He exhibits no distension and no mass. There is no tenderness.  Musculoskeletal: He exhibits no edema or tenderness.  Neurological:  Alert, AO x 1 (self). Exam limited by dementia. II: Unable to assess VF III, IV, VI: PERRL.  Unable to assess EOM. CN V, VII - XII grossly intact Strength 5/5 symmetric in bilateral UEs and LEs DTRs 2+ symmetric patella Finger-to-nose coordination grossly intact  Skin:  Skin is warm and dry. No rash noted.  Psychiatric:  Alert, cooperative, affect normal, mood good.  Speech dysarthric secondary to lack of dentition.  Speech content impoverished.    CT Head and Neck: R intraventricular hemorrhage, no mass effect, no fractures or subluxation  CBC Latest Ref Rng 10/04/2015 05/06/2015 05/04/2015  WBC 4.0 - 10.5 K/uL 7.8 5.8 8.7  Hemoglobin 13.0 - 17.0 g/dL 16.113.8 09.614.6 04.514.0  Hematocrit 39.0 - 52.0 % 40.8 41.6 41.8  Platelets 150 - 400 K/uL 220 394 193   CMP Latest Ref Rng 10/04/2015 05/06/2015 05/04/2015  Glucose 65 - 99 mg/dL 98 93 90  BUN 6 - 20 mg/dL 20 18 40(J23(H)  Creatinine 0.61 - 1.24 mg/dL 8.111.05 9.140.79 7.821.08  Sodium 135 - 145 mmol/L 140 141 141  Potassium 3.5 - 5.1 mmol/L 4.3 3.7 4.6  Chloride 101 - 111 mmol/L 107 105 110  CO2 22 - 32 mmol/L 28 24 21(L)  Calcium 8.9 - 10.3 mg/dL 8.9 9.5(A8.7(L) 8.3(L)  Total Protein 6.5 - 8.1 g/dL 6.6 - 6.0(L)  Total Bilirubin 0.3 - 1.2 mg/dL 0.7 - 1.2  Alkaline Phos 38 - 126 U/L 76 - 65  AST 15 - 41 U/L 17 - 20  ALT 17 - 63 U/L 13(L) - 14(L)   INR 1.01 PTT 13.5  Assessment & Plan by Problem: Principal Problem:   Ventricular hemorrhage (HCC) Active Problems:   Vascular dementia   BPH (benign prostatic hyperplasia)   Intraventricular hemorrhage (HCC)  #Head Injury #Intraventricular hemorrhage Patient appears well, and his lack of headache and nonfocal neuro exam are reassuring that he does not have increased ICP, mass effect, or parenchymal insult.  CT scan showed R intraventricular hemorrhage without mass effect.  Primary intraventricular hemorrhage is rare, more often associated with a parenchymal or subarachnoid bleed.  The principal potential complications are acute obstructive or delayed communicating hydrocephalus.  Given this Recurrent hemorrhage or an occult primary locus of bleeding are also concerns.  His high GCS, lack of coagulopathy, and lack of hydrocephalus at presentation are positive prognostic factors. -Repeat CT  head 7/8 PM -Q4h nursing neuro checks -Elevate head of bed  #Vascular Dementia -Continue home citalopram, risperidone for mood and behavioral regulation  #DVT Prophylaxis -Avoid chemoprophylaxis for bleeding risk -SCDs  #BPH -Continue home tamsulosin, finasteride  Dispo: Admit patient to Observation with expected length of stay less than 2 midnights.  Signed: Alm BustardMatthew O'Sullivan, MD 10/05/2015, 1:24 AM  Pager: [phone]

## 2015-10-05 ENCOUNTER — Observation Stay (HOSPITAL_COMMUNITY): Payer: Medicare (Managed Care)

## 2015-10-05 ENCOUNTER — Inpatient Hospital Stay (HOSPITAL_COMMUNITY): Payer: Medicare (Managed Care)

## 2015-10-05 DIAGNOSIS — N400 Enlarged prostate without lower urinary tract symptoms: Secondary | ICD-10-CM | POA: Diagnosis present

## 2015-10-05 DIAGNOSIS — W01190A Fall on same level from slipping, tripping and stumbling with subsequent striking against furniture, initial encounter: Secondary | ICD-10-CM | POA: Diagnosis present

## 2015-10-05 DIAGNOSIS — F0390 Unspecified dementia without behavioral disturbance: Secondary | ICD-10-CM | POA: Diagnosis not present

## 2015-10-05 DIAGNOSIS — N3941 Urge incontinence: Secondary | ICD-10-CM | POA: Diagnosis present

## 2015-10-05 DIAGNOSIS — Y9389 Activity, other specified: Secondary | ICD-10-CM | POA: Diagnosis not present

## 2015-10-05 DIAGNOSIS — S0101XA Laceration without foreign body of scalp, initial encounter: Secondary | ICD-10-CM | POA: Diagnosis present

## 2015-10-05 DIAGNOSIS — I615 Nontraumatic intracerebral hemorrhage, intraventricular: Secondary | ICD-10-CM | POA: Diagnosis not present

## 2015-10-05 DIAGNOSIS — Z79899 Other long term (current) drug therapy: Secondary | ICD-10-CM | POA: Diagnosis not present

## 2015-10-05 DIAGNOSIS — F0151 Vascular dementia with behavioral disturbance: Secondary | ICD-10-CM | POA: Diagnosis not present

## 2015-10-05 DIAGNOSIS — R296 Repeated falls: Secondary | ICD-10-CM | POA: Diagnosis present

## 2015-10-05 DIAGNOSIS — S06340A Traumatic hemorrhage of right cerebrum without loss of consciousness, initial encounter: Secondary | ICD-10-CM | POA: Diagnosis present

## 2015-10-05 DIAGNOSIS — Y9289 Other specified places as the place of occurrence of the external cause: Secondary | ICD-10-CM | POA: Diagnosis not present

## 2015-10-05 DIAGNOSIS — F0150 Vascular dementia without behavioral disturbance: Secondary | ICD-10-CM | POA: Diagnosis present

## 2015-10-05 DIAGNOSIS — K439 Ventral hernia without obstruction or gangrene: Secondary | ICD-10-CM | POA: Diagnosis present

## 2015-10-05 DIAGNOSIS — Z66 Do not resuscitate: Secondary | ICD-10-CM | POA: Diagnosis present

## 2015-10-05 LAB — BASIC METABOLIC PANEL
ANION GAP: 7 (ref 5–15)
BUN: 12 mg/dL (ref 6–20)
CO2: 23 mmol/L (ref 22–32)
Calcium: 8.9 mg/dL (ref 8.9–10.3)
Chloride: 109 mmol/L (ref 101–111)
Creatinine, Ser: 0.98 mg/dL (ref 0.61–1.24)
GFR calc non Af Amer: >60 mL/min (ref >60)
GLUCOSE: 89 mg/dL (ref 65–99)
POTASSIUM: 4 mmol/L (ref 3.5–5.1)
Sodium: 139 mmol/L (ref 135–145)

## 2015-10-05 LAB — MRSA PCR SCREENING: MRSA by PCR: NEGATIVE

## 2015-10-05 MED ORDER — TAMSULOSIN HCL 0.4 MG PO CAPS
0.4000 mg | ORAL_CAPSULE | Freq: Every day | ORAL | Status: DC
  Administered 2015-10-05 – 2015-10-10 (×5): 0.4 mg via ORAL
  Filled 2015-10-05 (×6): qty 1

## 2015-10-05 MED ORDER — CITALOPRAM HYDROBROMIDE 10 MG PO TABS
20.0000 mg | ORAL_TABLET | Freq: Every day | ORAL | Status: DC
  Administered 2015-10-05 – 2015-10-10 (×5): 20 mg via ORAL
  Filled 2015-10-05 (×6): qty 2

## 2015-10-05 MED ORDER — FINASTERIDE 5 MG PO TABS
5.0000 mg | ORAL_TABLET | Freq: Every day | ORAL | Status: DC
  Administered 2015-10-05 – 2015-10-10 (×5): 5 mg via ORAL
  Filled 2015-10-05 (×6): qty 1

## 2015-10-05 MED ORDER — ACETAMINOPHEN 325 MG PO TABS
650.0000 mg | ORAL_TABLET | Freq: Four times a day (QID) | ORAL | Status: DC | PRN
  Administered 2015-10-06 – 2015-10-07 (×2): 650 mg via ORAL
  Filled 2015-10-05 (×2): qty 2

## 2015-10-05 MED ORDER — RISPERIDONE 0.5 MG PO TABS
0.2500 mg | ORAL_TABLET | Freq: Every day | ORAL | Status: DC
  Administered 2015-10-05 – 2015-10-07 (×3): 0.25 mg via ORAL
  Filled 2015-10-05 (×4): qty 1

## 2015-10-05 MED ORDER — BOOST / RESOURCE BREEZE PO LIQD
1.0000 | Freq: Two times a day (BID) | ORAL | Status: DC
  Administered 2015-10-05 – 2015-10-09 (×5): 1 via ORAL
  Filled 2015-10-05 (×14): qty 1

## 2015-10-05 MED ORDER — ACETAMINOPHEN 650 MG RE SUPP: 650.0000 mg | Freq: Four times a day (QID) | RECTAL | Status: DC | PRN

## 2015-10-05 NOTE — Progress Notes (Signed)
   Subjective: Patient appears comfortable this morning. Unable to answer questions 2/2 to dementia.   Objective: Vital signs in last 24 hours: Filed Vitals:   10/04/15 2342 10/05/15 0046 10/05/15 0537 10/05/15 0911  BP: 143/73 149/70 139/80 122/74  Pulse: 71 64 95 95  Temp:  98.6 F (37 C) 98.6 F (37 C) 97.7 F (36.5 C)  TempSrc:  Oral Axillary Oral  Resp: _0 SpO2: 98% 96% 98% 98%   Physical Exam Constitutional: NAD, elderly gentleman laying in bed. HEENT: Posterior scalp laceration with staples in place. No deformation of the skull. Cardiovascular: RRR, no murmurs, rups, or gallops. Pulmonary/Chest: CTAB Abdominal: Soft, non tender, non distended. Extremities: no edema, distal pulses intact Neurological: Alert to person but not place or time. CN II - XII appear grossly intact, exam limited due to dementia. DTR 2+ bilaterally. No focal deficits identified.    Ref. Range 10/05/2015 06:18  Sodium Latest Ref Range: 135-145 mmol/L 139  Potassium Latest Ref Range: 3.5-5.1 mmol/L 4.0  Chloride Latest Ref Range: 101-111 mmol/L 109  CO2 Latest Ref Range: 22-32 mmol/L 23  BUN Latest Ref Range: 6-20 mg/dL 12  Creatinine Latest Ref Range: 0.61-1.24 mg/dL 0.98  Calcium Latest Ref Range: 8.9-10.3 mg/dL 8.9  EGFR (Non-African Amer.) Latest Ref Range: >60 mL/min >60  EGFR (African American) Latest Ref Range: >60 mL/min >60  Glucose Latest Ref Range: 65-99 mg/dL 89  Anion gap Latest Ref Range: 5-15  7   Imaging:  IMPRESSION: Atrophy with small vessel chronic ischemic changes of deep cerebral white matter.  Intraventricular hemorrhage within the RIGHT lateral ventricle at the occipital horn and atrium.  Probable old lacunar infarcts at the thalamus bilaterally.  No intraparenchymal hemorrhage or acute infarct identified.  Multilevel degenerative disc and facet disease changes cervical spine.  No acute cervical spine abnormalities.  Assessment/Plan:  R  Intraventricular Hemorrhage: 2/2 to fall. Patient appears to be at baseline mentation with nonfocal neurological exam. No signs of ICP, mass effect or parenchymal insult. History limited 2/2 to dementia but collateral was provided by the patient's wife. CT scan on 7/6 with IVH within the R lateral ventricle at the occipital horn and atrium.   - Plan for repeat head CT at 8:00pm - Q4h nursing neuro checks - Neurosurg following, appreciate recs  Vascular dementia: - Continue home citalpram, risperidone   DVT ppx:  - Avoid chemoprophylaxis due to bleeding risk - SCDs  BPH - Continue home tamsulosin, finasteride   Dispo: Anticipated discharge in approximately 1-2 day(s).     Velna Ochs, MD 10/05/2015, 1:13 PM Pager: [phone]

## 2015-10-05 NOTE — Clinical Social Work Note (Signed)
Clinical Social Work Assessment  Patient Details  Name: Mark Pena MRN: [id number] Date of Birth: 09/08/1930  Date of referral:  10/05/15               Reason for consult:  Discharge Planning                Permission sought to share information with:  Family Supports, Magazine features editoracility Contact Representative, Case Estate manager/land agentManager Permission granted to share information::  Yes, Verbal Permission Granted  Name::      Mark Cleverly(Vicki Mills )  Agency::   [facility] )  Relationship::   (Spouse )  Contact Information:   [phone]  Housing/Transportation Living arrangements for the past 2 months:  Skilled Nursing Facility Source of Information:  Facility, Spouse Patient Interpreter Needed:  None Criminal Activity/Legal Involvement Pertinent to Current Situation/Hospitalization:  No - Comment as needed Significant Relationships:  Spouse Lives with:  Facility Resident Do you feel safe going back to the place where you live?  Yes Need for family participation in patient care:  No (Coment)  Care giving concerns:  To return to SNF once stable for d/c.    Social Worker assessment / plan:  [facility] and plans to return once stable for d/c. Facility aware and prepared to admit patient once stable. FL-2 completed and faxed via HUB. CSW will continue to follow pt and pt's family for continued support and to facilitate d/c plans.  Employment status:  Retired Database administratornsurance information:  Managed Medicare PT Recommendations:  Not assessed at this time Information / Referral to community resources:  Skilled Nursing Facility  Patient/Family's Response to care:  Pt disoriented. Spouse supportive and involved in care.   Patient/Family's Understanding of and Emotional Response to Diagnosis, Current Treatment, and Prognosis:  Pt's spouse aware of diagnosis and on-going care.   Emotional Assessment Appearance:  Appears stated  age Attitude/Demeanor/Rapport:  Unable to Assess Affect (typically observed):  Unable to Assess Orientation:  Oriented to Self Alcohol / Substance use:  Not Applicable Psych involvement (Current and /or in the community):  No (Comment)  Discharge Needs  Concerns to be addressed:  Care Coordination Readmission within the last 30 days:  No Current discharge risk:  Dependent with Mobility Barriers to Discharge:  Continued Medical Work up   The Sherwin-WilliamsBashira Nyelle Wolfson, MSW, LCSWA [phone] 10/05/2015 3:25 PM

## 2015-10-05 NOTE — Consult Note (Signed)
Reason for Consult:chi Referring Physician: er  DENCIL Mark Pena is an 82 y.o. male.  HPI: patient who fell backwards and hit his head. No loc . Brought to the er had a scalp laceration repaired and ct head showed iv hemorrhage  Past Medical History  Diagnosis Date  . Dementia   . Benign prostatic hyperplasia (BPH) with urinary urge incontinence   . Abdominal wall hernia     Past Surgical History  Procedure Laterality Date  . Cataract extraction      No family history on file.  Social History:  reports that he does not drink alcohol or use illicit drugs. His tobacco history is not on file.  Allergies: No Known Allergies  Medications: see hp  Results for orders placed or performed during the hospital encounter of 10/04/15 (from the past 48 hour(s))  CBC with Differential     Status: None   Collection Time: 10/04/15  9:21 PM  Result Value Ref Range   WBC 7.8 4.0 - 10.5 K/uL   RBC 4.32 4.22 - 5.81 MIL/uL   Hemoglobin 13.8 13.0 - 17.0 g/dL   HCT 40.8 39.0 - 52.0 %   MCV 94.4 78.0 - 100.0 fL   MCH 31.9 26.0 - 34.0 pg   MCHC 33.8 30.0 - 36.0 g/dL   RDW 13.2 11.5 - 15.5 %   Platelets 220 150 - 400 K/uL   Neutrophils Relative % 73 %   Neutro Abs 5.7 1.7 - 7.7 K/uL   Lymphocytes Relative 15 %   Lymphs Abs 1.1 0.7 - 4.0 K/uL   Monocytes Relative 10 %   Monocytes Absolute 0.8 0.1 - 1.0 K/uL   Eosinophils Relative 2 %   Eosinophils Absolute 0.2 0.0 - 0.7 K/uL   Basophils Relative 0 %   Basophils Absolute 0.0 0.0 - 0.1 K/uL  Protime-INR     Status: None   Collection Time: 10/04/15  9:21 PM  Result Value Ref Range   Prothrombin Time 13.5 11.6 - 15.2 seconds   INR 1.01 0.00 - 1.49  Comprehensive metabolic panel     Status: Abnormal   Collection Time: 10/04/15  9:21 PM  Result Value Ref Range   Sodium 140 135 - 145 mmol/L   Potassium 4.3 3.5 - 5.1 mmol/L   Chloride 107 101 - 111 mmol/L   CO2 28 22 - 32 mmol/L   Glucose, Bld 98 65 - 99 mg/dL   BUN 20 6 - 20 mg/dL    Creatinine, Ser 1.05 0.61 - 1.24 mg/dL   Calcium 8.9 8.9 - 10.3 mg/dL   Total Protein 6.6 6.5 - 8.1 g/dL   Albumin 4.0 3.5 - 5.0 g/dL   AST 17 15 - 41 U/L   ALT 13 (L) 17 - 63 U/L   Alkaline Phosphatase 76 38 - 126 U/L   Total Bilirubin 0.7 0.3 - 1.2 mg/dL   GFR calc non Af Amer >60 >60 mL/min   GFR calc Af Amer >60 >60 mL/min    Comment: (NOTE) The eGFR has been calculated using the CKD EPI equation. This calculation has not been validated in all clinical situations. eGFR's persistently <60 mL/min signify possible Chronic Kidney Disease.    Anion gap 5 5 - 15  MRSA PCR Screening     Status: None   Collection Time: 10/05/15  4:40 AM  Result Value Ref Range   MRSA by PCR NEGATIVE NEGATIVE    Comment:        The GeneXpert MRSA  Assay (FDA approved for NASAL specimens only), is one component of a comprehensive MRSA colonization surveillance program. It is not intended to diagnose MRSA infection nor to guide or monitor treatment for MRSA infections.   Basic metabolic panel     Status: None   Collection Time: 10/05/15  6:18 AM  Result Value Ref Range   Sodium 139 135 - 145 mmol/L   Potassium 4.0 3.5 - 5.1 mmol/L   Chloride 109 101 - 111 mmol/L   CO2 23 22 - 32 mmol/L   Glucose, Bld 89 65 - 99 mg/dL   BUN 12 6 - 20 mg/dL   Creatinine, Ser 0.98 0.61 - 1.24 mg/dL   Calcium 8.9 8.9 - 10.3 mg/dL   GFR calc non Af Amer >60 >60 mL/min   GFR calc Af Amer >60 >60 mL/min    Comment: (NOTE) The eGFR has been calculated using the CKD EPI equation. This calculation has not been validated in all clinical situations. eGFR's persistently <60 mL/min signify possible Chronic Kidney Disease.    Anion gap 7 5 - 15    Ct Head Wo Contrast  10/04/2015  CLINICAL DATA:  Witnessed fall at 1715 hours at skilled nursing facility, stumbled and fell backwards striking posterior head on corner can out in day room, no loss of consciousness, not on blood thinners, posterior head laceration, history  dementia EXAM: CT HEAD WITHOUT CONTRAST CT CERVICAL SPINE WITHOUT CONTRAST TECHNIQUE: Multidetector CT imaging of the head and cervical spine was performed following the standard protocol without intravenous contrast. Multiplanar CT image reconstructions of the cervical spine were also generated. COMPARISON:  None. CT head 05/03/2015 FINDINGS: CT HEAD FINDINGS Generalized atrophy. Diffuse dilatation of the ventricular system, chronic. No midline shift or mass effect. Extensive small vessel chronic ischemic changes of deep cerebral white matter. Intraventricular hemorrhage identified at the dilated atrium and occipital horn of the RIGHT lateral ventricle. No intraparenchymal hemorrhage or extra-axial fluid collections. No evidence of mass or acute infarction. Probable old thalamic lacunar infarcts bilaterally. LEFT parieto-occipital scalp hematoma. Calvaria intact. Scattered mucosal thickening in the ethmoid air cells. Remaining paranasal sinuses and mastoid air cells clear. CT CERVICAL SPINE FINDINGS Prevertebral soft tissues normal thickness. Multilevel facet degenerative changes. Scattered mild disc space narrowing and endplate spur formation greatest at C6-C7. Minimal anterolisthesis C5-C6 likely due to degenerative disc and facet disease. No acute fracture, additional subluxation, or bone destruction. Visualized skullbase intact. Tips of lung apices clear. IMPRESSION: Atrophy with small vessel chronic ischemic changes of deep cerebral white matter. Intraventricular hemorrhage within the RIGHT lateral ventricle at the occipital horn and atrium. Probable old lacunar infarcts at the thalamus bilaterally. No intraparenchymal hemorrhage or acute infarct identified. Multilevel degenerative disc and facet disease changes cervical spine. No acute cervical spine abnormalities. Findings called to Gloriann Loan PA on 10/04/2015 at 2011 hours. Electronically Signed   By: Lavonia Dana M.D.   On: 10/04/2015 20:12   Ct Cervical  Spine Wo Contrast  10/04/2015  CLINICAL DATA:  Witnessed fall at 1715 hours at skilled nursing facility, stumbled and fell backwards striking posterior head on corner can out in day room, no loss of consciousness, not on blood thinners, posterior head laceration, history dementia EXAM: CT HEAD WITHOUT CONTRAST CT CERVICAL SPINE WITHOUT CONTRAST TECHNIQUE: Multidetector CT imaging of the head and cervical spine was performed following the standard protocol without intravenous contrast. Multiplanar CT image reconstructions of the cervical spine were also generated. COMPARISON:  None. CT head 05/03/2015 FINDINGS: CT HEAD  FINDINGS Generalized atrophy. Diffuse dilatation of the ventricular system, chronic. No midline shift or mass effect. Extensive small vessel chronic ischemic changes of deep cerebral white matter. Intraventricular hemorrhage identified at the dilated atrium and occipital horn of the RIGHT lateral ventricle. No intraparenchymal hemorrhage or extra-axial fluid collections. No evidence of mass or acute infarction. Probable old thalamic lacunar infarcts bilaterally. LEFT parieto-occipital scalp hematoma. Calvaria intact. Scattered mucosal thickening in the ethmoid air cells. Remaining paranasal sinuses and mastoid air cells clear. CT CERVICAL SPINE FINDINGS Prevertebral soft tissues normal thickness. Multilevel facet degenerative changes. Scattered mild disc space narrowing and endplate spur formation greatest at C6-C7. Minimal anterolisthesis C5-C6 likely due to degenerative disc and facet disease. No acute fracture, additional subluxation, or bone destruction. Visualized skullbase intact. Tips of lung apices clear. IMPRESSION: Atrophy with small vessel chronic ischemic changes of deep cerebral white matter. Intraventricular hemorrhage within the RIGHT lateral ventricle at the occipital horn and atrium. Probable old lacunar infarcts at the thalamus bilaterally. No intraparenchymal hemorrhage or acute  infarct identified. Multilevel degenerative disc and facet disease changes cervical spine. No acute cervical spine abnormalities. Findings called to Gloriann Loan PA on 10/04/2015 at 2011 hours. Electronically Signed   By: Lavonia Dana M.D.   On: 10/04/2015 20:12    Review of Systems  Unable to perform ROS: medical condition   Blood pressure 122/74, pulse 95, temperature 97.7 F (36.5 C), temperature source Oral, resp. rate 20, SpO2 98 %. Physical Exam  Oriented x1 . No weakness. Cn, wnl but with decrease of hearing. Sensory wnl. Ct head and spine seen  Assessment/Plan: Repeat ct head in 24 h or before prn  Quenisha Lovins M 10/05/2015, 10:39 AM

## 2015-10-05 NOTE — NC FL2 (Signed)
  [place] MEDICAID FL2 LEVEL OF CARE SCREENING TOOL     IDENTIFICATION  Patient Name: Mark Pena Birthdate: 08/29/1930 Sex: male Admission Date (Current Location): 10/04/2015  [facility] [place]   Facility and Address:  [facility], [address], [place], [place] [address]      Provider Number: [id number]  Attending Physician Name and Address:  Burns SpainElizabeth A Butcher, MD  Relative Name and Phone Number:       Current Level of Care: Hospital Recommended Level of Care: Skilled Nursing Facility Prior Approval Number:    Date Approved/Denied:   PASRR Number: [id number]    Discharge Plan: SNF    Current Diagnoses: Patient Active Problem List   Diagnosis Date Noted  . Intraventricular hemorrhage (HCC)   . Ventricular hemorrhage (HCC) 10/04/2015  . BPH (benign prostatic hyperplasia) 10/04/2015  . Fever 05/03/2015  . Acute encephalopathy 05/03/2015  . SIRS (systemic inflammatory response syndrome) (HCC) 05/03/2015  . Hematemesis 05/03/2015  . Vascular dementia 05/03/2015  . Pressure ulcer 05/03/2015    Orientation RESPIRATION BLADDER Height & Weight     Self  Normal Incontinent Weight:   Height:     BEHAVIORAL SYMPTOMS/MOOD NEUROLOGICAL BOWEL NUTRITION STATUS   (NONE )  (NONE ) Continent Diet (HEART HEALTHY )  AMBULATORY STATUS COMMUNICATION OF NEEDS Skin   Limited Assist Verbally Other (Comment) (Posterier head laceration )                       Personal Care Assistance Level of Assistance  Bathing, Feeding Bathing Assistance: Maximum assistance Feeding assistance: Independent Dressing Assistance: Maximum assistance     Functional Limitations Info  Sight, Hearing, Speech Sight Info: Adequate Hearing Info: Adequate Speech Info: Adequate    SPECIAL CARE FACTORS FREQUENCY                       Contractures      Additional Factors Info  Code Status, Allergies Code Status Info:  DNR Allergies Info: N/A            Current Medications (10/05/2015):  This is the current hospital active medication list Current Facility-Administered Medications  Medication Dose Route Frequency Provider Last Rate Last Dose  . acetaminophen (TYLENOL) tablet 650 mg  650 mg Oral Q6H PRN Tasrif Ahmed, MD       Or  . acetaminophen (TYLENOL) suppository 650 mg  650 mg Rectal Q6H PRN Tasrif Ahmed, MD      . citalopram (CELEXA) tablet 20 mg  20 mg Oral Daily Tasrif Ahmed, MD   20 mg at 10/05/15 0958  . feeding supplement (BOOST / RESOURCE BREEZE) liquid 1 Container  1 Container Oral BID BM Hyacinth Meekerasrif Ahmed, MD   1 Container at 10/05/15 [id number]  . finasteride (PROSCAR) tablet 5 mg  5 mg Oral Daily Tasrif Ahmed, MD   5 mg at 10/05/15 0958  . risperiDONE (RISPERDAL) tablet 0.25 mg  0.25 mg Oral QHS Tasrif Ahmed, MD   0.25 mg at 10/05/15 0123  . tamsulosin (FLOMAX) capsule 0.4 mg  0.4 mg Oral Daily Tasrif Ahmed, MD   0.4 mg at 10/05/15 [id number]     Discharge Medications: Please see discharge summary for a list of discharge medications.  Relevant Imaging Results:  Relevant Lab Results:   Additional Information SSN [id number]   Derenda FennelBashira Tyffani Foglesong, MSW, LCSWA [phone] 10/05/2015 10:11 AM

## 2015-10-05 NOTE — Care Management Note (Signed)
Case Management Note  Patient Details  Name: Mark Pena MRN: [id number] Date of Birth: 10/25/1930  Subjective/Objective:    Pt in with ventricular hemorrhage from a fall. [facility]                 Action/Plan: Awaiting PT/OT recommendations. Plan will be for [facility] to [facility] at [facility]. CM following for discharge needs.   Expected Discharge Date:   (unknown)               Expected Discharge Plan:  Skilled Nursing Facility  In-House Referral:     Discharge planning Services     Post Acute Care Choice:    Choice offered to:     DME Arranged:    DME Agency:     HH Arranged:    HH Agency:     Status of Service:  In process, will continue to follow  If discussed at Long Length of Stay Meetings, dates discussed:    Additional Comments:  Kermit BaloKelli F Bianey Tesoro, RN 10/05/2015, 4:50 PM

## 2015-10-06 LAB — CBC
HCT: 41.2 % (ref 39.0–52.0)
Hemoglobin: 13.7 g/dL (ref 13.0–17.0)
MCH: 31.4 pg (ref 26.0–34.0)
MCHC: 33.3 g/dL (ref 30.0–36.0)
MCV: 94.3 fL (ref 78.0–100.0)
PLATELETS: 199 10*3/uL (ref 150–400)
RBC: 4.37 MIL/uL (ref 4.22–5.81)
RDW: 12.9 % (ref 11.5–15.5)
WBC: 6.7 10*3/uL (ref 4.0–10.5)

## 2015-10-06 NOTE — Progress Notes (Signed)
Upon shift change, patient was wet.  When cleaning the patient Nurse Arish Redner and Nurse Marylene LandAngela noticed a red circle spot on his buttocks.  Skin protectant and incontinent spray were used to clean patient.  Pink foam placed.  Will continue to monitor.

## 2015-10-06 NOTE — Progress Notes (Signed)
Patient ID: Mark Pena, male   DOB: 07/22/1930, 82 y.o.   MRN: [id number] Sleepy, confused , no weakness. To get a repeat ct head

## 2015-10-06 NOTE — Progress Notes (Signed)
   Subjective: Sleepier this morning. Arousable to voice for me. Says he feels fine.  Objective: Vital signs in last 24 hours: Filed Vitals:   10/05/15 2052 10/06/15 0112 10/06/15 0528 10/06/15 0533  BP: 126/69 148/82 119/60   Pulse: 69 77 72   Temp: 98.3 F (36.8 C) 97.9 F (36.6 C) 98.1 F (36.7 C)   TempSrc: Oral Oral Axillary   Resp: 18 20 20    Height:    5\' 7"  (1.702 m)  Weight:    195 lb 3.2 oz (88.542 kg)  SpO2: 98% 98% 97%    Physical Exam General Apperance: NAD HEENT: Normocephalic, atraumatic, anicteric sclera Neck: Supple, trachea midline Lungs: Clear to auscultation bilaterally. No wheezes, rhonchi or rales. Breathing comfortably Heart: Regular rate and rhythm, no murmur/rub/gallop Abdomen: Soft, nontender, nondistended, no rebound/guarding Extremities: Warm and well perfused, no edema Skin: No rashes or lesions Neurologic: Somnolent but arousable to voice and follows commands, interactive. Oriented x 2. No gross deficits.  Assessment/Plan: 82 year old man with vascular dementia presenting after fall with head injury. Found to have Intraventricular hemorrhage on imaging.  Intraventricular Hemorrhage: Repeat CT head at 24 hours with stable hemorrhage along anterior lateral wall of teh atria of the right lateral ventricle with intraventricular extension into the occipital horn unchanged. -Neurosurg following, appreciate recommendations -Monitor for now. Repeat CT head in 48 hours.  Dementia: Continue home citalopram and risperidone BPH: Continue home tamsulosin and finasteride  FEN: Dys 1 VTE ppx: SCDs Code: DNR  Dispo: Awaiting improvement in mental status. 0-1 days expected.   LOS: 1 day   Lora PaulaJennifer T Jaliza Seifried, MD 10/06/2015, 8:10 AM Pager: [phone]

## 2015-10-06 NOTE — Progress Notes (Signed)
Patient ID: Mark Pena, male   DOB: 01/20/1931, 82 y.o.   MRN: [id number] Ct done yesterday unchanged. Will get a new one in 48 h

## 2015-10-07 LAB — CBC
HEMATOCRIT: 44 % (ref 39.0–52.0)
Hemoglobin: 14.8 g/dL (ref 13.0–17.0)
MCH: 31.9 pg (ref 26.0–34.0)
MCHC: 33.6 g/dL (ref 30.0–36.0)
MCV: 94.8 fL (ref 78.0–100.0)
Platelets: 202 10*3/uL (ref 150–400)
RBC: 4.64 MIL/uL (ref 4.22–5.81)
RDW: 13 % (ref 11.5–15.5)
WBC: 6.9 10*3/uL (ref 4.0–10.5)

## 2015-10-07 NOTE — Progress Notes (Signed)
Patient ID: Mark Pena, male   DOB: 04/14/1930, [age] y.o.   MRN: [id number] No change in exam. Following. Repeat CT tomorrow.

## 2015-10-07 NOTE — Clinical Social Work Note (Signed)
[facility] can [facility] once medically stable for d/c.   CSW will continue to follow patient and pt's family for continued support and to facilitate pt's d/c needs once medically stable.  Derenda FennelBashira Ameira Alessandrini, MSW, LCSWA [phone] 10/07/2015 3:19 PM

## 2015-10-07 NOTE — Progress Notes (Signed)
   Subjective: Unchanged this morning. Again he says he feels fine. Still somnolent.  Objective: Vital signs in last 24 hours: Filed Vitals:   10/06/15 2300 10/07/15 0204 10/07/15 0527 10/07/15 0935  BP: 135/65 168/88 137/77 107/59  Pulse: 75 105 85 83  Temp: 98.5 F (36.9 C) 98.6 F (37 C) 98.1 F (36.7 C) 97.5 F (36.4 C)  TempSrc: Oral Oral Axillary Axillary  Resp: 20 20 20 20   Height:      Weight:      SpO2: 93% 98% 95% 98%   Physical Exam General Apperance: NAD HEENT: Anicteric sclera Neck: Supple, trachea midline Lungs: Clear to auscultation bilaterally. No wheezes, rhonchi or rales. Breathing comfortably Heart: Regular rate and rhythm, no murmur/rub/gallop Abdomen: Soft, nontender, nondistended, no rebound/guarding Extremities: Warm and well perfused, no edema Skin: No rashes or lesions Neurologic: Somnolent but arousable to voice. No gross deficits.  Assessment/Plan: 82 year old man with vascular dementia presenting after fall with head injury. Found to have Intraventricular hemorrhage on imaging.  Intraventricular Hemorrhage: Repeat CT head at 24 hours with stable hemorrhage along anterior lateral wall of teh atria of the right lateral ventricle with intraventricular extension into the occipital horn unchanged. -Neurosurg following, appreciate recommendations -Monitor for now. Repeat CT head in 48 hours (tomorrow)  Dementia: Continue home citalopram and risperidone BPH: Continue home tamsulosin and finasteride  FEN: Dys 1 VTE ppx: SCDs Code: DNR  Dispo: Awaiting improvement in mental status. 0-1 days expected.   LOS: 2 days   Lora PaulaJennifer T Krall, MD 10/07/2015, 11:37 AM Pager: [phone]

## 2015-10-07 NOTE — Evaluation (Signed)
Physical Therapy Evaluation Patient Details Name: Mark Pena MRN: [id number] DOB: 12/13/1930 Today's Date: 10/07/2015   History of Present Illness  Pt is a 82 y/o M with vascular dementia presenting after fall and found to have Intraventricular hemorrhage on imaging.  Pt's PMH includes urge incontinence.    Clinical Impression  Pt admitted with above diagnosis. Pt currently with functional limitations due to the deficits listed below (see PT Problem List). Mark Pena presents with impaired balance and generalized weakness.  He currently requires +2 min assist for sit<>stand and pivot transfers.  Would benefit from continued therapy services at SNF upon d/c. Pt will benefit from skilled PT to increase their independence and safety with mobility to allow discharge to the venue listed below.      Follow Up Recommendations SNF;Supervision/Assistance - 24 hour    Equipment Recommendations  Other (comment) (TBD at next venue of care)    Recommendations for Other Services OT consult     Precautions / Restrictions Precautions Precautions: Fall Restrictions Weight Bearing Restrictions: No      Mobility  Bed Mobility Overal bed mobility: Needs Assistance Bed Mobility: Supine to Sit     Supine to sit: Min guard;HOB elevated     General bed mobility comments: Increased time and effort with use of bed rail with HOB elevated.  Transfers Overall transfer level: Needs assistance Equipment used: Rolling walker (2 wheeled) Transfers: Sit to/from [employer]Stand;Stand Pivot Transfers Sit to Stand: Min assist;+2 physical assistance;+2 safety/equipment Stand pivot transfers: Min assist;+2 physical assistance;+2 safety/equipment       General transfer comment: Pt appeared comfortable with RW, suspect he uses RW at baseline.  Cues for hand placement and to steady with sit<>stand and stand pivot.  Pt requires max cues to facilitate stand>sit.  Ambulation/Gait                Stairs             Wheelchair Mobility    Modified Rankin (Stroke Patients Only)       Balance Overall balance assessment: Needs assistance;History of Falls Sitting-balance support: No upper extremity supported;Feet supported Sitting balance-Leahy Scale: Poor Sitting balance - Comments: Relies on at least 1 UE to be supported on bed Postural control: Posterior lean Standing balance support: Bilateral upper extremity supported;During functional activity Standing balance-Leahy Scale: Poor Standing balance comment: Relies on support of RW and physical assist                             Pertinent Vitals/Pain Pain Assessment: No/denies pain    Home Living Family/patient expects to be discharged to:: Skilled nursing facility                 Additional Comments: [facility].    Prior Function Level of Independence: Needs assistance         Comments: Pt unable to provide information but suspect pt was requiring assist for mobility and ADLs PTA.     Hand Dominance        Extremity/Trunk Assessment   Upper Extremity Assessment: Generalized weakness           Lower Extremity Assessment: Generalized weakness      Cervical / Trunk Assessment: Kyphotic  Communication   Communication: HOH  Cognition Arousal/Alertness: Awake/alert Behavior During Therapy: WFL for tasks assessed/performed Overall Cognitive Status: History of cognitive impairments - at baseline  General Comments      Exercises General Exercises - Lower Extremity Ankle Circles/Pumps: Both;10 reps;Seated Straight Leg Raises: Both;5 reps;Supine      Assessment/Plan    PT Assessment Patient needs continued PT services  PT Diagnosis Difficulty walking;Generalized weakness   PT Problem List Decreased strength;Decreased activity tolerance;Decreased balance;Decreased mobility;Decreased knowledge of use of  DME;Decreased safety awareness  PT Treatment Interventions DME instruction;Gait training;Functional mobility training;Therapeutic activities;Therapeutic exercise;Balance training;Cognitive remediation;Patient/family education   PT Goals (Current goals can be found in the Care Plan section) Acute Rehab PT Goals Patient Stated Goal: none stated PT Goal Formulation: With patient Time For Goal Achievement: 10/21/15 Potential to Achieve Goals: Good    Frequency Min 2X/week   Barriers to discharge        Co-evaluation               End of Session Equipment Utilized During Treatment: Gait belt Activity Tolerance: Patient tolerated treatment well Patient left: in chair;with call bell/phone within reach;with chair alarm set Nurse Communication: Mobility status;Other (comment) (requested soft touch call bell from secretary; none on floor)         Time: 1610-9604 PT Time Calculation (min) (ACUTE ONLY): 21 min   Charges:   PT Evaluation $PT Eval Moderate Complexity: 1 Procedure     PT G Codes:       Encarnacion Chu PT, DPT  Pager: [phone] Phone: [phone] 10/07/2015, 3:47 PM

## 2015-10-08 ENCOUNTER — Inpatient Hospital Stay (HOSPITAL_COMMUNITY): Payer: Medicare (Managed Care)

## 2015-10-08 LAB — BASIC METABOLIC PANEL
Anion gap: 6 (ref 5–15)
BUN: 13 mg/dL (ref 6–20)
CO2: 25 mmol/L (ref 22–32)
Calcium: 8.9 mg/dL (ref 8.9–10.3)
Chloride: 107 mmol/L (ref 101–111)
Creatinine, Ser: 0.99 mg/dL (ref 0.61–1.24)
GFR calc non Af Amer: >60 mL/min (ref >60)
Glucose, Bld: 94 mg/dL (ref 65–99)
Potassium: 4 mmol/L (ref 3.5–5.1)
SODIUM: 138 mmol/L (ref 135–145)

## 2015-10-08 LAB — CBC
HCT: 44 % (ref 39.0–52.0)
Hemoglobin: 14.4 g/dL (ref 13.0–17.0)
MCH: 30.8 pg (ref 26.0–34.0)
MCHC: 32.7 g/dL (ref 30.0–36.0)
MCV: 94.2 fL (ref 78.0–100.0)
PLATELETS: 213 10*3/uL (ref 150–400)
RBC: 4.67 MIL/uL (ref 4.22–5.81)
RDW: 13 % (ref 11.5–15.5)
WBC: 7 10*3/uL (ref 4.0–10.5)

## 2015-10-08 NOTE — Clinical Social Work Note (Signed)
[facility] Social Worker called to inquire about dc date. CSW told her that they are doing a repeat head CT in 48-72 hours. She will let SNF know.  Charlynn CourtSarah Elbert Spickler, CSW [phone]

## 2015-10-08 NOTE — Progress Notes (Signed)
Patient ID: Mark Pena, male   DOB: 08/24/1930, 82 y.o.   MRN: [id number] Sleepy, moves all 4 extremities. Ct head seen. Continue observation. Repeat ct head in 48-72 hours

## 2015-10-08 NOTE — Progress Notes (Signed)
   Subjective: Sleepy this morning on exam. Would not open eyes but responsive to his name.   Objective: Vital signs in last 24 hours: Filed Vitals:   10/07/15 2149 10/08/15 0031 10/08/15 0636 10/08/15 0956  BP: 142/74 148/59 140/78 134/86  Pulse: 68 74 6 78  Temp: 97.7 F (36.5 C) 98 F (36.7 C) 98.2 F (36.8 C) 97.7 F (36.5 C)  TempSrc: Oral Oral Oral Oral  Resp: '18 18 18 18  '$ Height:      Weight:      SpO2: 97% 97% 97%    Labs:  Ref. Range 10/08/2015 07:49  Sodium Latest Ref Range: 135-145 mmol/L 138  Potassium Latest Ref Range: 3.5-5.1 mmol/L 4.0  Chloride Latest Ref Range: 101-111 mmol/L 107  CO2 Latest Ref Range: 22-32 mmol/L 25  BUN Latest Ref Range: 6-20 mg/dL 13  Creatinine Latest Ref Range: 0.61-1.24 mg/dL 0.99  Calcium Latest Ref Range: 8.9-10.3 mg/dL 8.9  EGFR (Non-African Amer.) Latest Ref Range: >60 mL/min >60  EGFR (African American) Latest Ref Range: >60 mL/min >60  Glucose Latest Ref Range: 65-99 mg/dL 94  Anion gap Latest Ref Range: 5-15  6  WBC Latest Ref Range: 4.0-10.5 K/uL 7.0  RBC Latest Ref Range: 4.22-5.81 MIL/uL 4.67  Hemoglobin Latest Ref Range: 13.0-17.0 g/dL 14.4  HCT Latest Ref Range: 39.0-52.0 % 44.0  MCV Latest Ref Range: 78.0-100.0 fL 94.2  MCH Latest Ref Range: 26.0-34.0 pg 30.8  MCHC Latest Ref Range: 30.0-36.0 g/dL 32.7  RDW Latest Ref Range: 11.5-15.5 % 13.0  Platelets Latest Ref Range: 150-400 K/uL 213    Imaging:  10/08/15 CT Head: IMPRESSION: Stable intraparenchymal hemorrhage seen adjacent to the right lateral ventricle. There is continued intraventricular extension primarily seen in the right occipital horn, which is slightly increased in size compared to prior exam. Stable small amount of intraventricular hemorrhage is seen in left occipital horn. Stable mild ventriculomegaly is noted most likely due to mild diffuse cortical atrophy.  Physical Exam General Apperance: NAD HEENT: Anicteric sclera Neck: Supple, trachea  midline Lungs: Clear to auscultation bilaterally. No wheezes, rhonchi or rales. Breathing comfortably Heart: Regular rate and rhythm, no murmur/rub/gallop Abdomen: Soft, nontender, nondistended, no rebound/guarding Extremities: Warm and well perfused, no edema Skin: No rashes or lesions Neurologic: Somnolent but arousable to voice. No gross deficits. Moving all extremities spontaneously.   Assessment/Plan: 82 year old man with vascular dementia presenting after fall with head injury. Found to have Intraventricular hemorrhage on imaging.  Intraventricular Hemorrhage: Repeat CT head at 24 hours with stable hemorrhage along anterior lateral wall of the atria of the right lateral ventricle with intraventricular extension into the occipital horn unchanged. Repeat head CT today with extension of the intraventricular hemorrhage seen primarily in the right occipital horn, slightly increased in size compared to prior study.  - Continue to monitor for now.  - Neurosurg following, appreciate recommendations  Dementia: Continue home citalopram, hold home risperidone given somnolent mental status.  BPH: Continue home tamsulosin and finasteride   FEN: Dys 1 VTE ppx: SCDs Code: DNR  Dispo: Anticipated discharge in approximately 1-2 day(s).   LOS: 3 days   Velna Ochs, MD 10/08/2015, 11:28 AM Pager: [phone]

## 2015-10-08 NOTE — Care Management Note (Signed)
Case Management Note  Patient Details  Name: Mark RidingRichard B Day MRN: [id number] Date of Birth: 05/04/1930  Subjective/Objective:                    Action/Plan: [facility] When medically ready [facility]. CM following for discharge needs.   Expected Discharge Date:   (unknown)               Expected Discharge Plan:  Skilled Nursing Facility  In-House Referral:     Discharge planning Services     Post Acute Care Choice:    Choice offered to:     DME Arranged:    DME Agency:     HH Arranged:    HH Agency:     Status of Service:  In process, will continue to follow  If discussed at Long Length of Stay Meetings, dates discussed:    Additional Comments:  Kermit BaloKelli F Mikena Masoner, RN 10/08/2015, 4:01 PM

## 2015-10-09 ENCOUNTER — Encounter (HOSPITAL_COMMUNITY): Payer: Self-pay | Admitting: General Practice

## 2015-10-09 DIAGNOSIS — F0390 Unspecified dementia without behavioral disturbance: Secondary | ICD-10-CM

## 2015-10-09 NOTE — Progress Notes (Signed)
OT Cancellation Note  Patient Details Name: Mark RidingRichard B Pena MRN: [id number] DOB: 07/20/1930   Cancelled Treatment:    Reason Eval/Treat Not Completed: Other (comment) -- [facility] and the d/c plan per chart review [facility]. Will defer OT needs to SNF. Thank you.  Shanley Furlough A 10/09/2015, 12:18 PM

## 2015-10-09 NOTE — Progress Notes (Signed)
Patient ID: Mark Pena, male   DOB: 01/17/1931, 82 y.o.   MRN: [id number] Confused but stable  Ok to transfer to a SNIF  Ct head1n 7 to 10 days

## 2015-10-09 NOTE — Progress Notes (Signed)
Pt at this time alert, able to state name and location. Pt follows commands, open eyes, and raise both arms. Did not move legs when instructed or wiggle toes. Pt did have short conversation with wife while rn in room.

## 2015-10-09 NOTE — Progress Notes (Signed)
This morning pt would not open eyes to any kind of stimulation. At present pt sitting up, eyes are open, pt is not talking, or answering questions. Pt does open mouth when spoon of food is held in front of lips. Pt no following any other commands. Will not open mouth on command.

## 2015-10-09 NOTE — Clinical Social Work Note (Signed)
[facility] and will return once stable for d/c.  CSW will continue to follow patient and pt's family for continued support and to facilitate pt's d/c needs once stable.   Derenda FennelBashira Elsie Sakuma, MSW, LCSWA [phone] 10/09/2015 12:19 PM

## 2015-10-09 NOTE — Progress Notes (Signed)
Wife asking many questions about care, did check and pt will be getting PT this afternoon. Did called md and make them aware that pt is more alert now and follows some commands, md will stop by again. Wife informed of all this

## 2015-10-09 NOTE — Progress Notes (Signed)
Pt goes in and out of confusion. rn explained that pt was being discharged and going back to facility Mark Pena, a few minutes later pt asking the same questions again. This continued several times.

## 2015-10-09 NOTE — Progress Notes (Signed)
   Subjective: Unchanged this morning. Continues to be very somnolent and sleepy in the mornings. Responsive to his name but unable to answer questions or follow commands. Per wife - patient tends be be more alert and interactive in the afternoon. She feels he is closer to his baseline after lunch and is concerned he is not sleeping at night.   Objective: Vital signs in last 24 hours: Filed Vitals:   10/09/15 0152 10/09/15 0522 10/09/15 1040 10/09/15 1425  BP: 115/65 119/70 127/66 112/67  Pulse: 84 77 85 90  Temp: 98.2 F (36.8 C) 98.3 F (36.8 C) 97.8 F (36.6 C) 97.6 F (36.4 C)  TempSrc: Oral Oral Axillary Oral  Resp: '18 17 18 18  '$ Height:      Weight:      SpO2: 95% 96% 96% 98%   Labs:  Ref. Range 10/08/2015 07:49  Sodium Latest Ref Range: 135-145 mmol/L 138  Potassium Latest Ref Range: 3.5-5.1 mmol/L 4.0  Chloride Latest Ref Range: 101-111 mmol/L 107  CO2 Latest Ref Range: 22-32 mmol/L 25  BUN Latest Ref Range: 6-20 mg/dL 13  Creatinine Latest Ref Range: 0.61-1.24 mg/dL 0.99  Calcium Latest Ref Range: 8.9-10.3 mg/dL 8.9  EGFR (Non-African Amer.) Latest Ref Range: >60 mL/min >60  EGFR (African American) Latest Ref Range: >60 mL/min >60  Glucose Latest Ref Range: 65-99 mg/dL 94  Anion gap Latest Ref Range: 5-15  6    Ref. Range 10/08/2015 07:49  WBC Latest Ref Range: 4.0-10.5 K/uL 7.0  RBC Latest Ref Range: 4.22-5.81 MIL/uL 4.67  Hemoglobin Latest Ref Range: 13.0-17.0 g/dL 14.4  HCT Latest Ref Range: 39.0-52.0 % 44.0  MCV Latest Ref Range: 78.0-100.0 fL 94.2  MCH Latest Ref Range: 26.0-34.0 pg 30.8  MCHC Latest Ref Range: 30.0-36.0 g/dL 32.7  RDW Latest Ref Range: 11.5-15.5 % 13.0  Platelets Latest Ref Range: 150-400 K/uL 213   Physical Exam General Apperance: NAD HEENT: Anicteric sclera Neck: Supple, trachea midline Lungs: Clear to auscultation bilaterally. No wheezes, rhonchi or rales. Breathing comfortably Heart: Regular rate and rhythm, no  murmur/rub/gallop Abdomen: Soft, nontender, nondistended, no rebound/guarding Extremities: Warm and well perfused, no edema Skin: No rashes or lesions Neurologic: Somnolent but arousable to voice. No gross deficits. Moving all extremities spontaneously.   Assessment/Plan: 82 year old man with vascular dementia presenting after fall with head injury. Found to have Intraventricular hemorrhage on imaging.  Intraventricular Hemorrhage: Repeat CT head at 24 hours (7/7) with stable hemorrhage along anterior lateral wall of the atria of the right lateral ventricle with intraventricular extension into the occipital horn unchanged. Further imaging on 7/10  with extension of the intraventricular hemorrhage seen primarily in the right occipital horn, slightly increased in size compared to prior study.  - Discussed with neurosurgery today who felt patient was stable to be discharged to [facility] with plans to repeat head CT in 7-10 days. Will plan for discharge back to [facility] tomorrow. Discussed with with wife who is agreeable to this plan. - Continue to monitor overnight - Neurosurgery following, appreciate recs  Dementia: Continue home citalopram, hold home risperidone given somnolent mental status.  BPH: Continue home tamsulosin and finasteride   FEN: Dys 1 VTE ppx: SCDs Code: DNR Dispo: Anticipated discharge in approximately 1-2 day(s).   LOS: 4 days   Velna Ochs, MD 10/09/2015, 4:41 PM Pager: [phone]

## 2015-10-09 NOTE — Progress Notes (Signed)
rn and tech cleaned pt up and turned pt, pt did not open eyes at all during this. md team made aware that pt was not given 1000 meds due to pt not opening eyes even to sternal rub and pt will not follow commands.

## 2015-10-10 ENCOUNTER — Inpatient Hospital Stay (HOSPITAL_COMMUNITY): Payer: Medicare (Managed Care)

## 2015-10-10 DIAGNOSIS — F0150 Vascular dementia without behavioral disturbance: Secondary | ICD-10-CM

## 2015-10-10 NOTE — Progress Notes (Signed)
Report given to Thompson GrayerKendra, Rn at facility. [facility] arrived and transporting pt.

## 2015-10-10 NOTE — Progress Notes (Signed)
Patient ID: Mark Pena, male   DOB: 09/18/1930, 82 y.o.   MRN: [id number] Neuro stable. Ct head today if he is going to be dc in am

## 2015-10-10 NOTE — Progress Notes (Signed)
Physical Therapy Treatment Patient Details Name: Mark RidingRichard B Pena MRN: [id number] DOB: 05/25/1930 Today's Date: 10/10/2015    History of Present Illness Pt is a 82 y/o M with vascular dementia presenting after fall and found to have Intraventricular hemorrhage on imaging.  Pt's PMH includes urge incontinence.    PT Comments    Patient not responding/following commands. Oriented to person but unable to state birthday. Worked on bed mobility and sit to stand. Max A+2 for sit to stand. Current plan remains appropriate.   Follow Up Recommendations  SNF;Supervision/Assistance - 24 hour     Equipment Recommendations  Other (comment) (TBD at next venue of care)    Recommendations for Other Services OT consult     Precautions / Restrictions Precautions Precautions: Fall Restrictions Weight Bearing Restrictions: No    Mobility  Bed Mobility Overal bed mobility: Needs Assistance Bed Mobility: Supine to Sit;Sit to Supine     Supine to sit: HOB elevated;Mod assist;+2 for physical assistance Sit to supine: Max assist;+2 for physical assistance   General bed mobility comments: assist to bring bilat LE to EOB, scoot hips to EOB with use of bed pad, and elevate trunkn into sitting; pt assisted by pulling on rail and attempted to scoot hips forward using bilat UE; pt not following vc  Transfers Overall transfer level: Needs assistance Equipment used: Rolling walker (2 wheeled) Transfers: Sit to/from Stand Sit to Stand: Max assist;+2 physical assistance;From elevated surface         General transfer comment: pt required max A +2 to power up into standing and maintain standing ~30 seconds but unable to achieve upright posture; pt not responding to cues but placed hands on RW upon stand  Ambulation/Gait                 Stairs            Wheelchair Mobility    Modified Rankin (Stroke Patients Only)       Balance Overall balance assessment: Needs  assistance Sitting-balance support: Bilateral upper extremity supported;Feet supported Sitting balance-Leahy Scale: Poor Sitting balance - Comments: min guard to min A to maintain sitting balance EOB     Standing balance-Leahy Scale: Zero                      Cognition Arousal/Alertness: Awake/alert Behavior During Therapy: WFL for tasks assessed/performed Overall Cognitive Status: History of cognitive impairments - at baseline                      Exercises      General Comments        Pertinent Vitals/Pain Pain Assessment: Faces Faces Pain Scale: No hurt Pain Intervention(s): Monitored during session    Home Living                      Prior Function            PT Goals (current goals can now be found in the care plan section) Acute Rehab PT Goals Patient Stated Goal: none stated PT Goal Formulation: With patient Time For Goal Achievement: 10/21/15 Potential to Achieve Goals: Good Progress towards PT goals: Not progressing toward goals - comment    Frequency  Min 2X/week    PT Plan Current plan remains appropriate    Co-evaluation             End of Session Equipment Utilized During Treatment: Gait belt Activity Tolerance: Patient  limited by lethargy Patient left: with call bell/phone within reach;in bed;with bed alarm set     Time: 1610-9604 PT Time Calculation (min) (ACUTE ONLY): 20 min  Charges:  $Therapeutic Activity: 8-22 mins                    G Codes:      Derek Mound, PTA Pager: [phone]   10/10/2015, 12:27 PM

## 2015-10-10 NOTE — Discharge Instructions (Signed)
Stitches, Staples, or Adhesive Wound Closure  Doctors use stitches (sutures), staples, and certain glue (skin adhesives) to hold your skin together while it heals (wound closure). You may need this treatment after you have surgery or if you cut your skin accidentally. These methods help your skin heal more quickly. They also make it less likely that you will have a scar.  WHAT ARE THE DIFFERENT KINDS OF WOUND CLOSURES?  There are many options for wound closure. The one that your doctor uses depends on how deep and large your wound is.  Adhesive Glue  To use this glue to close a wound, your doctor holds the edges of the wound together and paints the glue on the surface of your skin. You may need more than one layer of glue. Then the wound may be covered with a light bandage (dressing).  This type of skin closure may be used for small wounds that are not deep (superficial). Using glue for wound closure is less painful than other methods. It does not require a medicine that numbs the area. This method also leaves nothing to be removed. Adhesive glue is often used for children and on facial wounds.  Adhesive glue cannot be used for wounds that are deep, uneven, or bleeding. It is not used inside of a wound.   Adhesive Strips  These strips are made of sticky (adhesive), porous paper. They are placed across your skin edges like a regular adhesive bandage. You leave them on until they fall off.  Adhesive strips may be used to close very superficial wounds. They may also be used along with sutures to improve closure of your skin edges.   Sutures  Sutures are the oldest method of wound closure. Sutures can be made from natural or synthetic materials. They can be made from a material that your body can break down as your wound heals (absorbable), or they can be made from a material that needs to be removed from your skin (nonabsorbable). They come in many different strengths and sizes.  Your doctor attaches the sutures to a  steel needle on one end. Sutures can be passed through your skin, or through the tissues beneath your skin. Then they are tied and cut. Your skin edges may be closed in one continuous stitch or in separate stitches.  Sutures are strong and can be used for all kinds of wounds. Absorbable sutures may be used to close tissues under the skin. The disadvantage of sutures is that they may cause skin reactions that lead to infection. Nonabsorbable sutures need to be removed.  Staples  When surgical staples are used to close a wound, the edges of your skin on both sides of the wound are brought close together. A staple is placed across the wound, and an instrument secures the edges together. Staples are often used to close surgical cuts (incisions).  Staples are faster to use than sutures, and they cause less reaction from your skin. Staples need to be removed using a tool that bends the staples away from your skin.  HOW DO I CARE FOR MY WOUND CLOSURE?  · Take medicines only as told by your doctor.  · If you were prescribed an antibiotic medicine for your wound, finish it all even if you start to feel better.  · Use ointments or creams only as told by your doctor.  · Wash your hands with soap and water before and after touching your wound.  · Do not soak your wound in   water. Do not take baths, swim, or use a hot tub until your doctor says it is okay.  · Ask your doctor when you can start showering. Cover your wound if told by your doctor.  · Do not take out your own sutures or staples.  · Do not pick at your wound. Picking can cause an infection.  · Keep all follow-up visits as told by your doctor. This is important.  HOW LONG WILL I HAVE MY WOUND CLOSURE?   · Leave adhesive glue on your skin until the glue peels away.  · Leave adhesive strips on your skin until they fall off.  · Absorbable sutures will dissolve within several days.  · Nonabsorbable sutures and staples must be removed. The location of the wound will  determine how long they stay in. This can range from several days to a couple of weeks.  WHEN SHOULD I SEEK HELP FOR MY WOUND CLOSURE?  Contact your doctor if:  · You have a fever.  · You have chills.  · You have redness, puffiness (swelling), or pain at the site of your wound.  · You have fluid, blood, or pus coming from your wound.  · There is a bad smell coming from your wound.  · The skin edges of your wound start to separate after your sutures have been removed.  · Your wound becomes thick, raised, and darker in color after your sutures come out (scarring).     This information is not intended to replace advice given to you by your health care provider. Make sure you discuss any questions you have with your health care provider.     Document Released: 01/12/2009 Document Revised: 04/07/2014 Document Reviewed: 08/24/2013  Elsevier Interactive Patient Education ©2016 [employer]

## 2015-10-10 NOTE — Discharge Summary (Signed)
Name: Mark Pena MRN: [id number] DOB: May 16, 1930 82 y.o. PCP: Miguel Aschoff, MD  Date of Admission: 10/04/2015  6:00 PM Date of Discharge: 10/10/2015 Attending Physician: Burns Spain, MD  Discharge Diagnosis: 1. Intraventricular hemorrhage  2. Vascular dementia   Discharge Medications:   Medication List    TAKE these medications        citalopram 20 MG tablet  Commonly known as:  CELEXA  Take 20 mg by mouth daily.     feeding supplement Liqd  Take 1 Container by mouth 2 (two) times daily.     finasteride 5 MG tablet  Commonly known as:  PROSCAR  Take 5 mg by mouth daily.     risperiDONE 0.25 MG tablet  Commonly known as:  RISPERDAL  Take 0.25 mg by mouth at bedtime.     tamsulosin 0.4 MG Caps capsule  Commonly known as:  FLOMAX  Take 0.4 mg by mouth daily.     Vitamin D (Ergocalciferol) 50000 units Caps capsule  Commonly known as:  DRISDOL  Take 50,000 Units by mouth every 30 (thirty) days.       Disposition and follow-up:   MarkMark Pena was discharged from [facility] in Stable condition.  At the hospital follow up visit please address:  1.  F/u neuro exam; IVH stable on discharge with no focal neurological deficits. Patient has been very somnolent in the mornings, responsive to name however unable to answer questions or follow commands. It was felt this was likely due to insomnia and being out of his usual routine. Wife says he is easily agitated when outside of his normal environment, and felt that he was more alert in the afternoon when she came to visit. Please f/u with wife about mental status to ensure pt is back to his baseline. Risperidone was initially started but then held given his somnolence. Restart as appropriate. Staples still in place (posterior scalp laceration). Placed on admission (7/6), please remove in the next few days.   2.  Labs / imaging needed at time of follow-up: f/u Head CT in 7-10 days  3.   Pending labs/ test needing follow-up: None  Follow-up Appointments:  Follow-up Information    Please follow up.   Contact information:   Follow up with [facility] on discharge      Hospital Course by problem list: Principal Problem:   Ventricular hemorrhage (HCC) Active Problems:   Vascular dementia   BPH (benign prostatic hyperplasia)   Intraventricular hemorrhage (HCC)   1. Intraventricular hemorrhage: Patient presented to the ED from [facility] after a fall where he fell backwards and hit his head on a piano. He did not lose consciousness. He was at baseline immediately following the fall. In the ED, head CT showed a right intraventricular hemorrhage. He had an occiput lac repaired by the ED staff with staples still in place. Neurosurgery recommended a repeat head CT at 24 hours which showed stable hemorrhage. During the hospital course, Mark Pena became increasingly somnolent throughout the day. He remained neurologically intact, however it was unclear at that time if the somnolence was from worsening bleed or 2/2 to insomnia while in the hospital. Per wife, patient seemed more alert and interactive in the afternoon. Repeat head CT on day 4 show mild extension of bleed but overall was not concerning. He was monitored for the following day without any worsening of mental status or acute neurological changes. CT today on discharge with improved subependymal  hemorrhage and unchanged IVH. Per neurosurgery, patient is stable for [facility] with plans to repeat head CT in 7-10 days.   2. Vascular Dementia: Continued home citalopram. Help home risperidone given somnolence.   Discharge Vitals:   BP 122/68 mmHg  Pulse 89  Temp(Src) 98.5 F (36.9 C) (Oral)  Resp 18  Ht 5\' 7"  (1.702 m)  Wt 195 lb 3.2 oz (88.542 kg)  BMI 30.57 kg/m2  SpO2 99%  Pertinent Labs, Studies, and Procedures:   Heat CT Wo Contrast - 10/04/15 IMPRESSION: Atrophy with small vessel chronic ischemic changes  of deep cerebral white matter.  Intraventricular hemorrhage within the RIGHT lateral ventricle at the occipital horn and atrium.  Probable old lacunar infarcts at the thalamus bilaterally.  No intraparenchymal hemorrhage or acute infarct identified.  Multilevel degenerative disc and facet disease changes cervical spine.  No acute cervical spine abnormalities.  Heat CT Wo Contrast - 10/05/15 IMPRESSION: Motion degraded images.  Stable parenchymal hemorrhage along the anterior lateral wall of the atria of the right lateral ventricle, with intraventricular extension into the occipital horn, unchanged.  Heat CT Wo Contrast - 10/08/15 IMPRESSION: Stable intraparenchymal hemorrhage seen adjacent to the right lateral ventricle. There is continued intraventricular extension primarily seen in the right occipital horn, which is slightly increased in size compared to prior exam. Stable small amount of intraventricular hemorrhage is seen in left occipital horn. Stable mild ventriculomegaly is noted most likely due to mild diffuse cortical atrophy.  Heat CT Wo Contrast - 10/10/15 IMPRESSION: Improving subependymal hemorrhage in the right posterior temporal lobe. Intraventricular hemorrhage unchanged. Diffuse ventricular enlargement compatible with atrophy is unchanged. Advanced chronic ischemic changes.   Discharge Instructions: Discharge Instructions    Call MD for:  difficulty breathing, headache or visual disturbances    Complete by:  As directed      Call MD for:  persistant dizziness or light-headedness    Complete by:  As directed      Call MD for:  persistant nausea and vomiting    Complete by:  As directed      Diet - low sodium heart healthy    Complete by:  As directed      Increase activity slowly    Complete by:  As directed            Signed: Reymundo Pollarolyn Javone Ybanez, MD 10/10/2015, 12:02 PM   Pager: [phone]

## 2015-10-10 NOTE — Clinical Social Work Note (Signed)
Clinical Social Worker facilitated patient discharge including contacting patient family and facility to confirm patient discharge plans.  Clinical information faxed to facility and family agreeable with plan.  CSW arranged ambulance transport via [facility].  RN to call report prior to discharge [phone].  Clinical Social Worker will sign off for now as social work intervention is no longer needed. Please consult us again if new need arises.  Derenda FennelBashira Richardson Dubree, MSW, LCSWA [phone] 10/10/2015 12:35 PM

## 2015-10-10 NOTE — Progress Notes (Signed)
   Subjective: Continues to be very sleepy in the mornings. Responsive to name but does not follow commands. Overall unchanged.  Objective: Vital signs in last 24 hours: Filed Vitals:   10/09/15 2232 10/10/15 0140 10/10/15 0514 10/10/15 0920  BP: 147/47 130/70 113/61 122/68  Pulse: 93 97 81 89  Temp: 98.5 F (36.9 C) 98.6 F (37 C) 98.9 F (37.2 C) 98.5 F (36.9 C)  TempSrc: Axillary Axillary Axillary Oral  Resp: 16 16 16 18   Height:      Weight:      SpO2: 96% 92% 95% 99%   Physical Exam General Apperance: NAD HEENT: Anicteric sclera Neck: Supple, trachea midline Lungs: Clear to auscultation bilaterally. No wheezes, rhonchi or rales. Breathing comfortably Heart: Regular rate and rhythm, no murmur/rub/gallop Abdomen: Soft, nontender, nondistended, no rebound/guarding Extremities: Warm and well perfused, no edema Skin: No rashes or lesions Neurologic: Somnolent but arousable to voice. No gross deficits. Moving all extremities spontaneously.   Assessment/Plan: 82 year old man with vascular dementia presenting after fall with head injury. Found to have Intraventricular hemorrhage on imaging.  Intraventricular Hemorrhage: Repeat CT head at 24 hours (7/7) with stable hemorrhage along anterior lateral wall of the atria of the right lateral ventricle with intraventricular extension into the occipital horn unchanged. Further imaging on (7/10) with extension of the intraventricular hemorrhage seen primarily in the right occipital horn, slightly increased in size compared to prior study.  - Discussed with neurosurgery yesterday who felt patient was [facility] with plans to repeat head CT in 7-10 days. Will plan [facility]. Discussed with with wife who is agreeable to this plan. - Head CT today before discharge per neurosurgery  - Continue to monitor overnight - Neurosurgery following, appreciate recs  Dementia: Continue home  citalopram, hold home risperidone given somnolent mental status.  BPH: Continue home tamsulosin and finasteride   Dispo: Anticipated discharge in approximately 1 day(s).   LOS: 5 days   Mark Pollarolyn Niyana Chesbro, MD 10/10/2015, 11:27 AM Pager: [phone]

## 2015-10-10 NOTE — Care Management Note (Signed)
Case Management Note  Patient Details  Name: Mark RidingRichard B Plake MRN: [id number] Date of Birth: 08/30/1930  Subjective/Objective:                    Action/Plan: Pt discharging [facility]. No further needs per CM.   Expected Discharge Date:   (unknown)               Expected Discharge Plan:  Skilled Nursing Facility  In-House Referral:  Clinical Social Work  Discharge planning Services  CM Consult  Post Acute Care Choice:    Choice offered to:     DME Arranged:    DME Agency:     HH Arranged:    HH Agency:     Status of Service:  Completed, signed off  If discussed at [employer] Meetings, dates discussed:    Additional Comments:  Kermit BaloKelli F Quiera Diffee, RN 10/10/2015, 12:03 PM

## 2015-10-17 ENCOUNTER — Other Ambulatory Visit: Payer: Self-pay | Admitting: Family Medicine

## 2015-10-17 DIAGNOSIS — S0990XA Unspecified injury of head, initial encounter: Secondary | ICD-10-CM

## 2015-10-19 ENCOUNTER — Ambulatory Visit
Admission: RE | Admit: 2015-10-19 | Discharge: 2015-10-19 | Disposition: A | Discharge status: Home / Self Care | Payer: Medicare (Managed Care) | Source: Ambulatory Visit | Attending: Family Medicine | Admitting: Family Medicine

## 2015-10-19 DIAGNOSIS — S0990XA Unspecified injury of head, initial encounter: Secondary | ICD-10-CM

## 2017-09-28 DEATH — deceased

## 2018-03-27 IMAGING — CT CT HEAD W/O CM
4 series · 16 of 47 positions shown, 18 images · non-contrast
Comparison: CT scan of October 05, 2015.

CLINICAL DATA: Intraventricular hemorrhage.

EXAM:
CT HEAD WITHOUT CONTRAST
TECHNIQUE: Contiguous axial images were obtained from the base of the skull
through the vertex without intravenous contrast.

[Series 2: head without · axial · non-contrast · 0.43mm/px · z∈[-238,-118]mm · 7 of 32 slices shown, 9 images]
[im 4/32  brain]
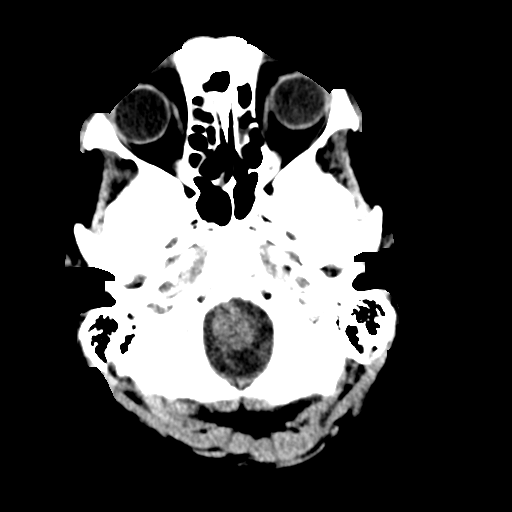
[im 4/32  bone]
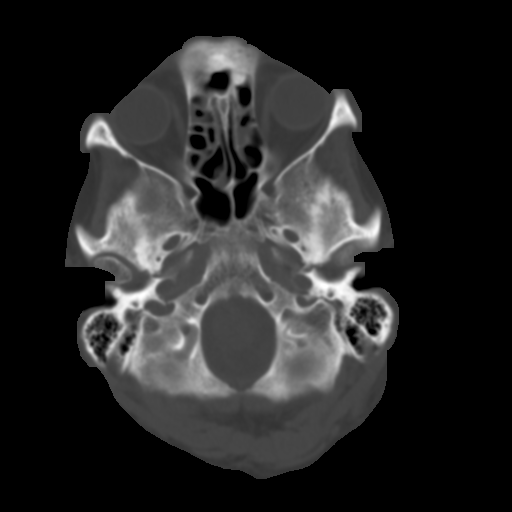
[im 8/32  brain]
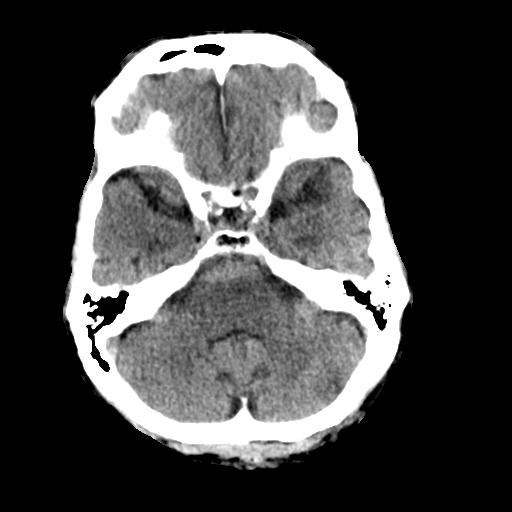
[im 12/32  brain]
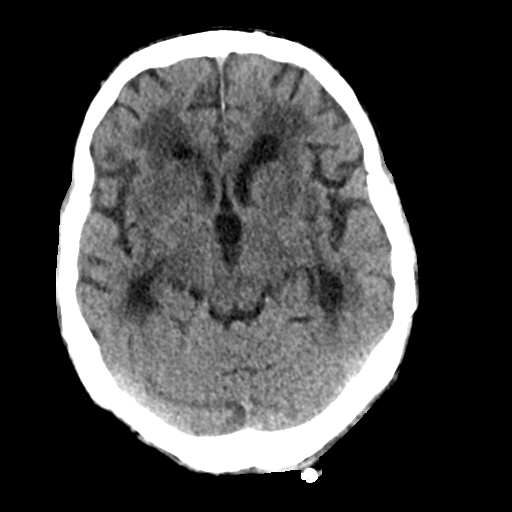
[im 16/32  brain]
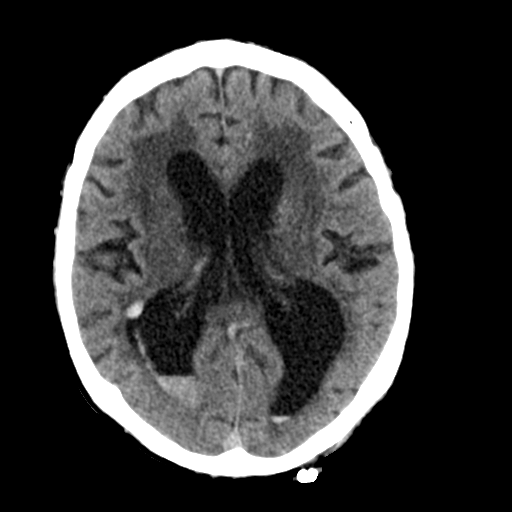
[im 20/32  brain]
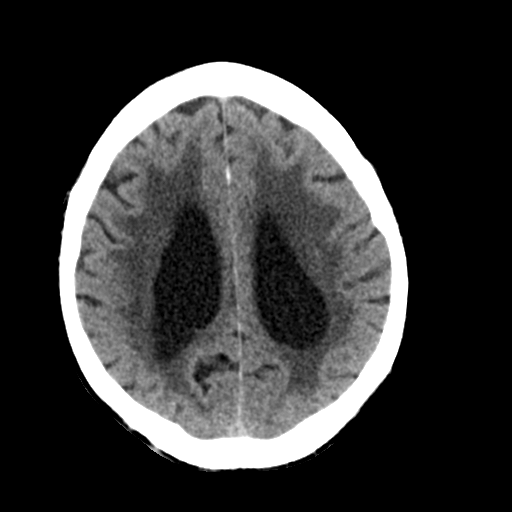
[im 20/32  bone]
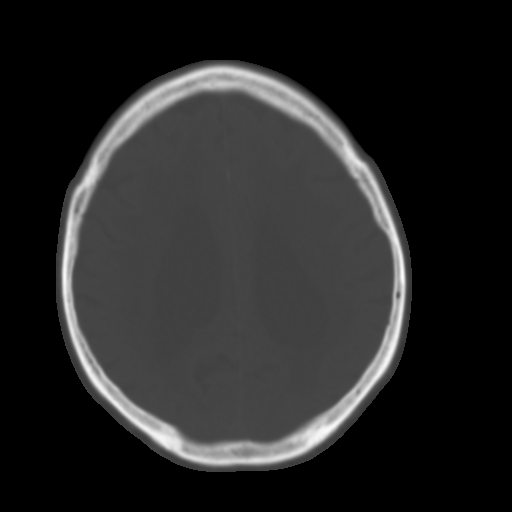
[im 24/32  brain]
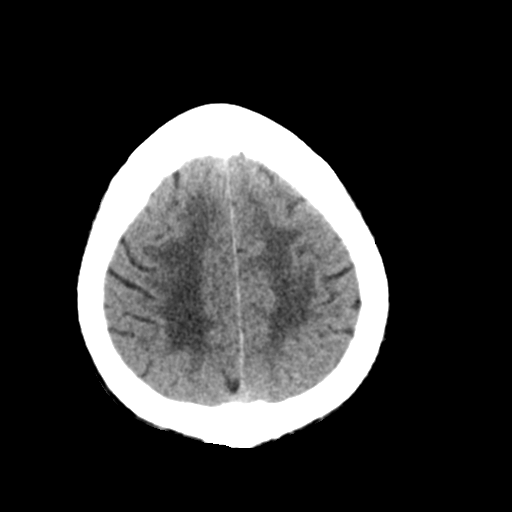
[im 28/32  brain]
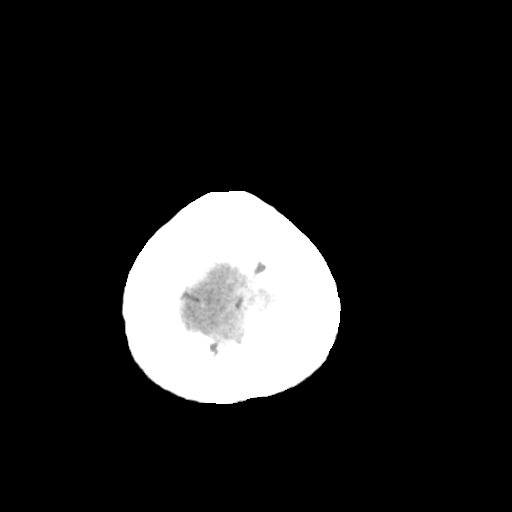

[Series 3: head bone · axial · 0.43mm/px · z∈[-239,-207]mm · 3 of 80 slices shown]
[im 8/80  bone]
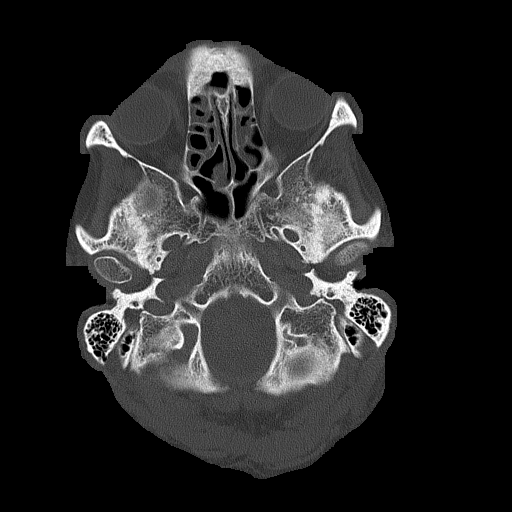
[im 16/80  bone]
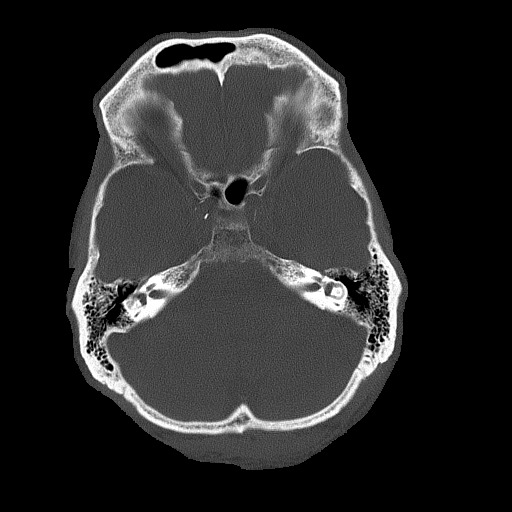
[im 24/80  bone]
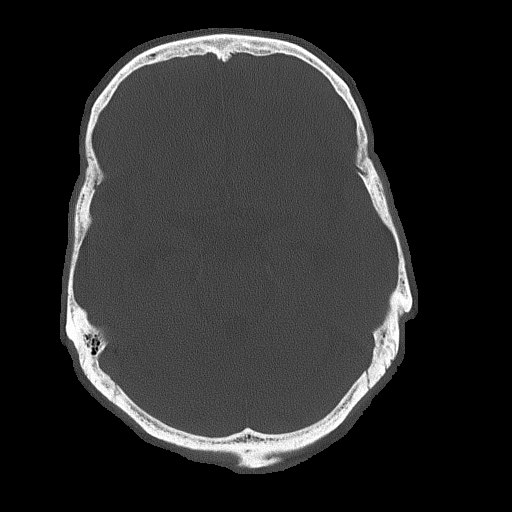

[Series 4: head without cor · coronal · non-contrast · 0.33mm/px · 3 of 70 slices shown]
[im 24/70  brain]
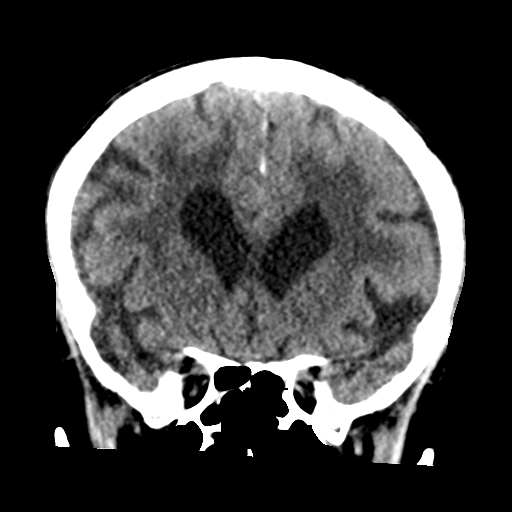
[im 31/70  brain]
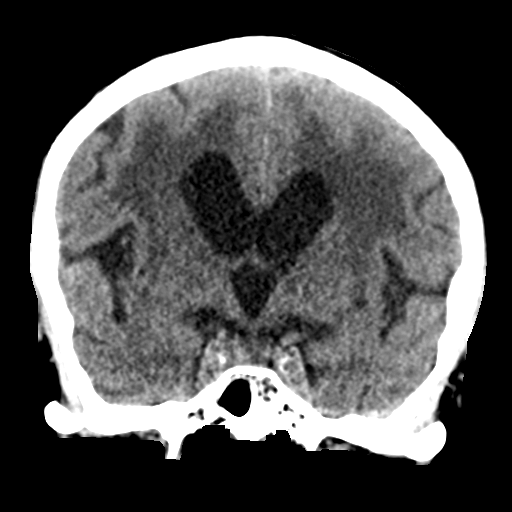
[im 39/70  brain]
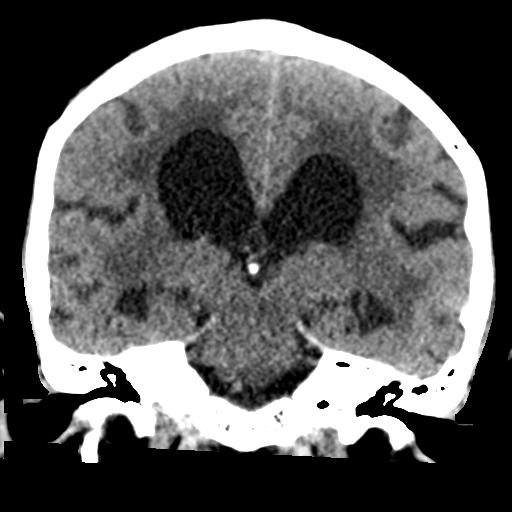

[Series 5: head without sag · sagittal · non-contrast · 0.30mm/px · 3 of 58 slices shown]
[im 20/58  brain]
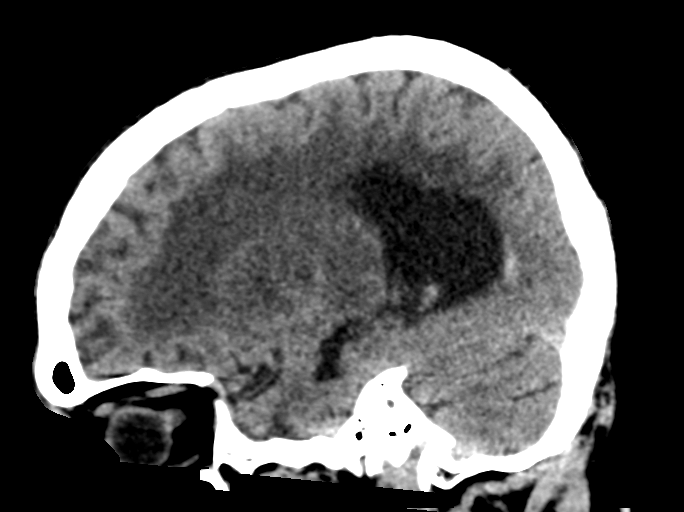
[im 29/58  brain]
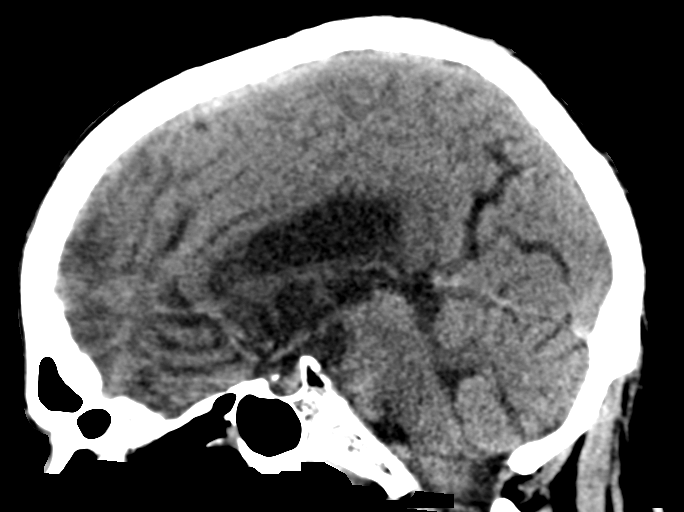
[im 39/58  brain]
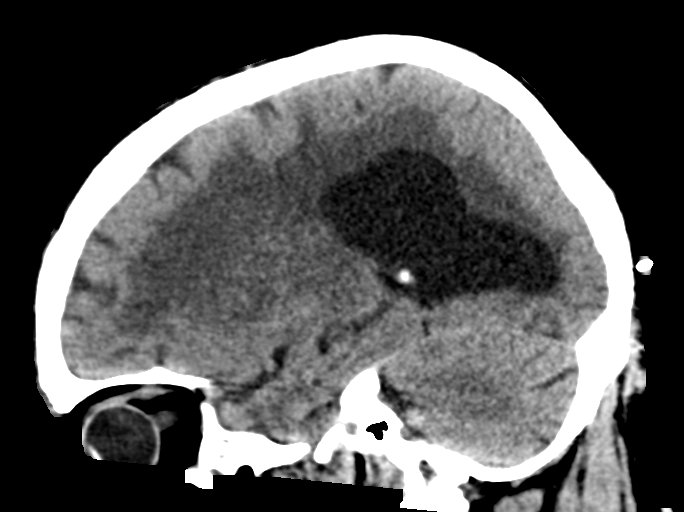

[16 of 47 positions shown; findings below may reference images not displayed]

FINDINGS: There is again noted stable 12 x 7 mm intraparenchymal hemorrhage
along the anterior and lateral wall of the posterior portion of
right lateral ventricle. Intraventricular extension is again noted
with mildly increased amount of hemorrhage seen in the posterior
horn of the right lateral ventricle. There is also noted a stable
amount of hemorrhage seen in the posterior horn of the left lateral
ventricle. Stable mild ventriculomegaly is noted which most likely
represents chronic finding. Moderate chronic ischemic white matter
disease is noted. No midline shift is noted. Mild diffuse cortical
atrophy remains. No new hemorrhage is noted. Bony calvarium is
unremarkable.
IMPRESSION: Stable intraparenchymal hemorrhage seen adjacent to the right
lateral ventricle. There is continued intraventricular extension
primarily seen in the right occipital horn, which is slightly
increased in size compared to prior exam. Stable small amount of
intraventricular hemorrhage is seen in left occipital horn. Stable
mild ventriculomegaly is noted most likely due to mild diffuse
cortical atrophy.

## 2018-03-29 IMAGING — CT CT HEAD W/O CM
2 series · 15 of 30 positions shown, 19 images · non-contrast
Comparison: 10/08/2015

CLINICAL DATA: Intracranial hemorrhage

EXAM:
CT HEAD WITHOUT CONTRAST
TECHNIQUE: Contiguous axial images were obtained from the base of the skull
through the vertex without intravenous contrast.

[Series 201: head w/o, idose (1) · axial · non-contrast · 0.49mm/px · z∈[+81,+216]mm · 13 of 33 slices shown, 17 images]
[im 3/33  brain]
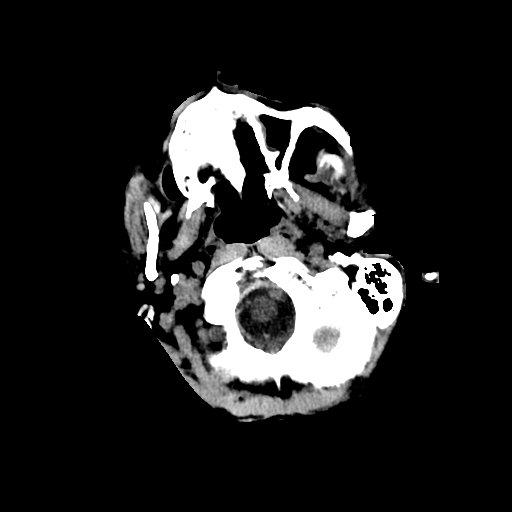
[im 3/33  bone]
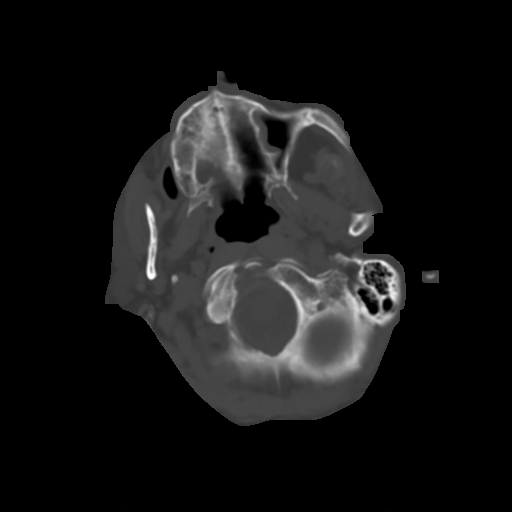
[im 5/33  brain]
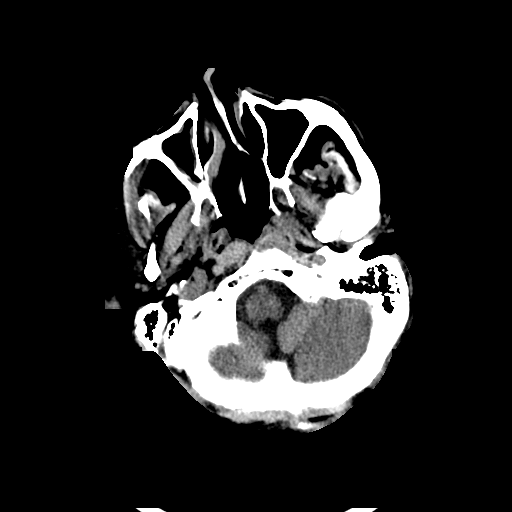
[im 7/33  brain]
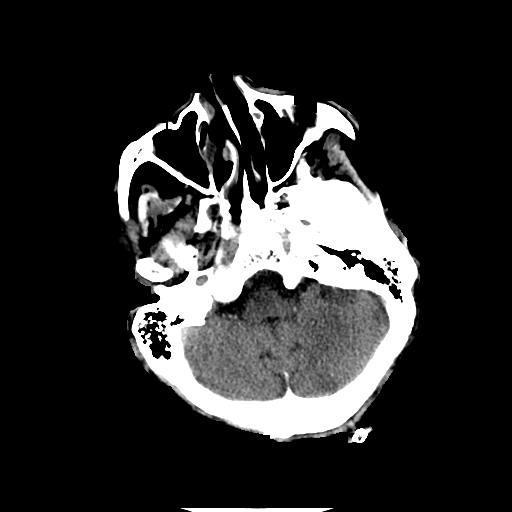
[im 10/33  brain]
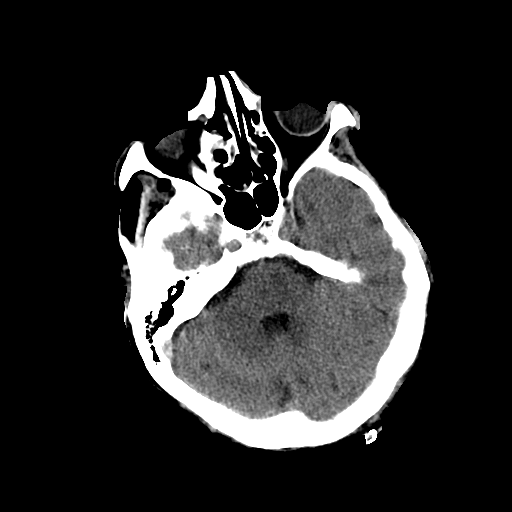
[im 12/33  brain]
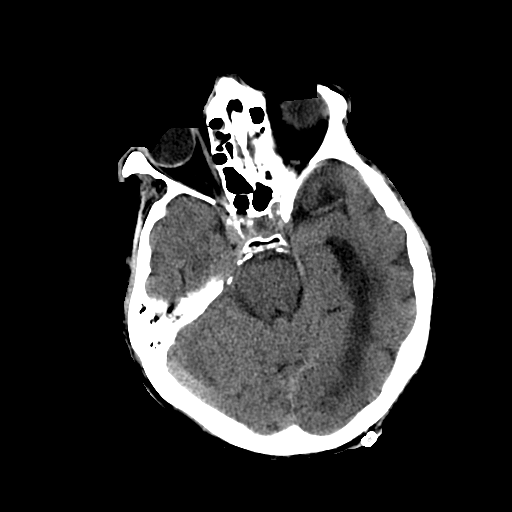
[im 12/33  bone]
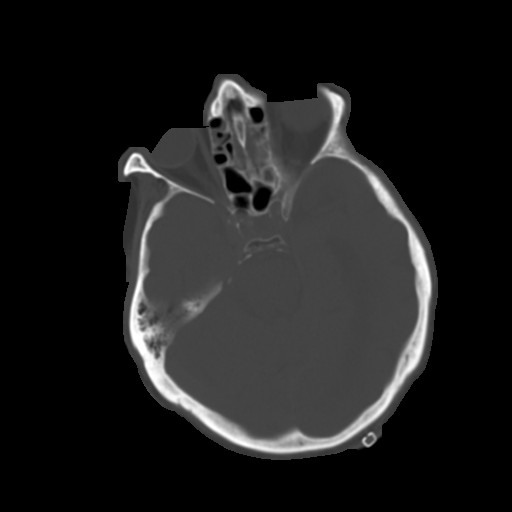
[im 14/33  brain]
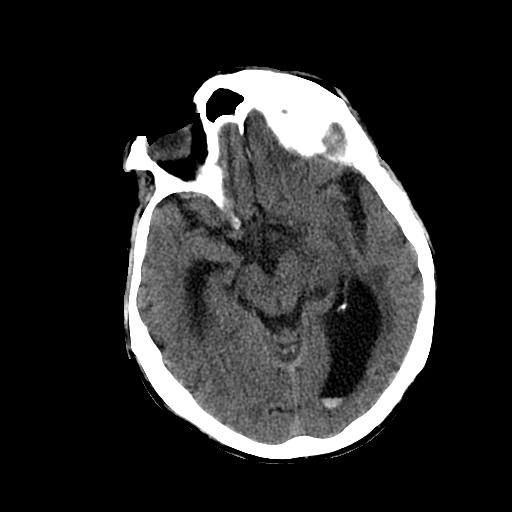
[im 17/33  brain]
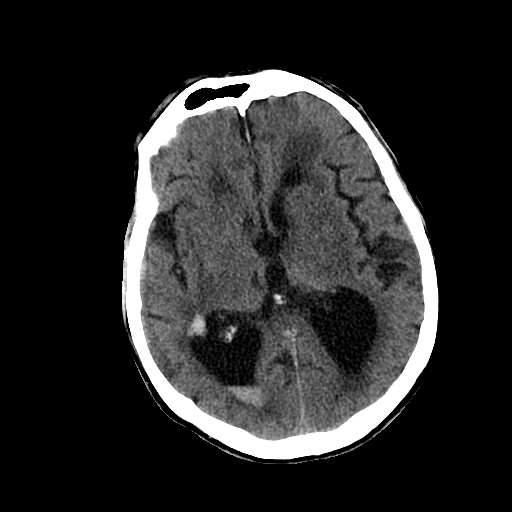
[im 19/33  brain]
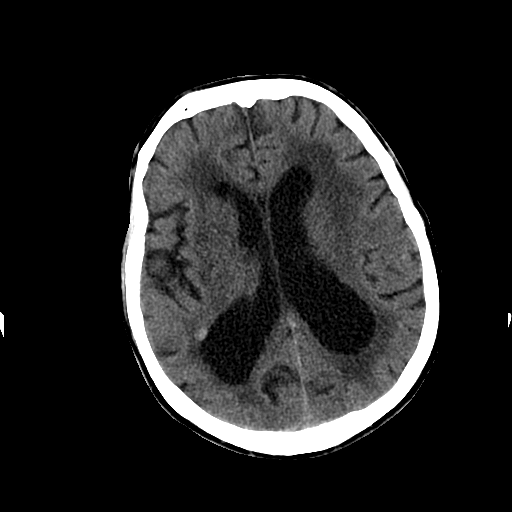
[im 21/33  brain]
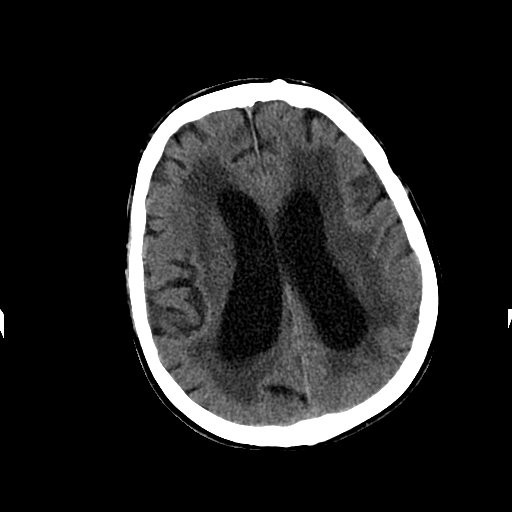
[im 21/33  bone]
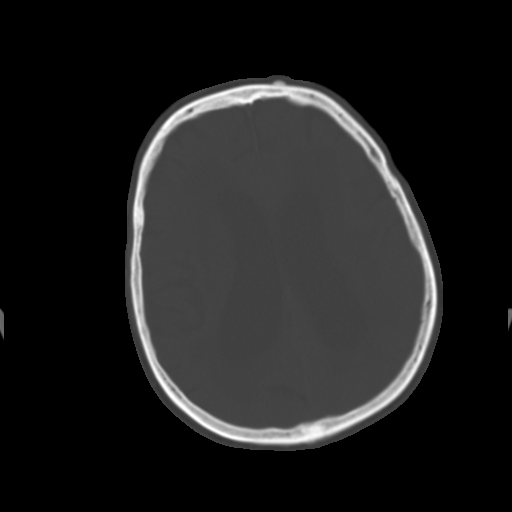
[im 23/33  brain]
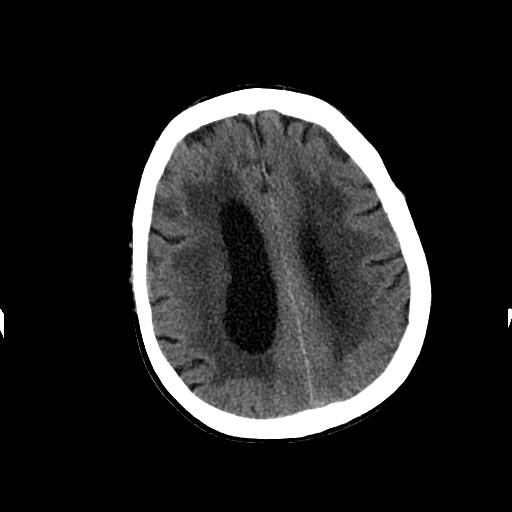
[im 26/33  brain]
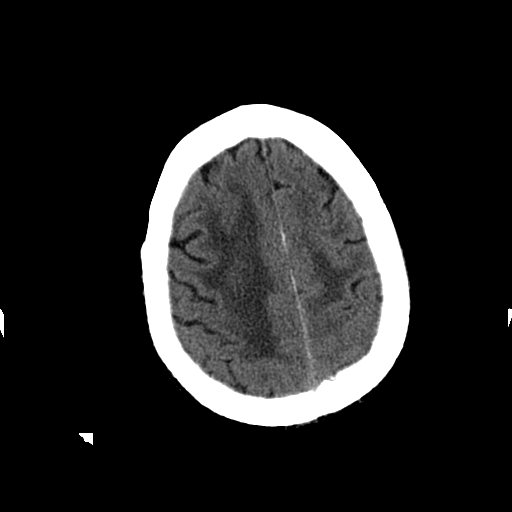
[im 28/33  brain]
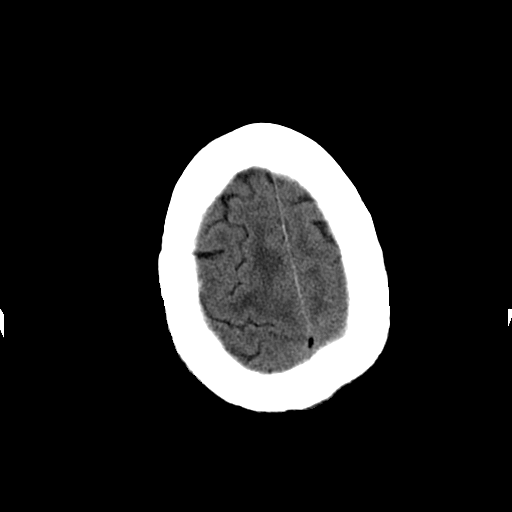
[im 30/33  brain]
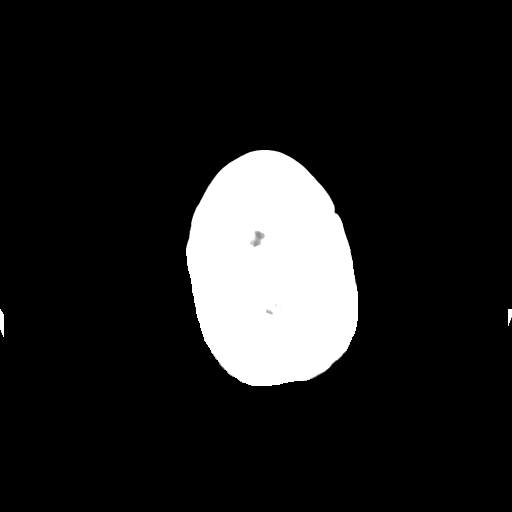
[im 30/33  bone]
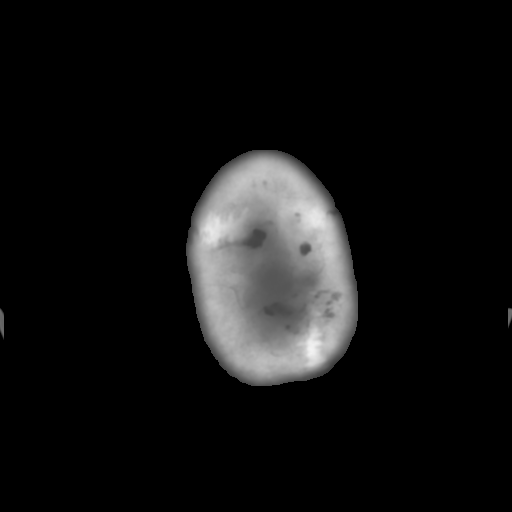

[Series 202: head w/o bone, idose (1) · axial · non-contrast · 0.49mm/px · z∈[+81,+101]mm · 2 of 33 slices shown]
[im 3/33  bone]
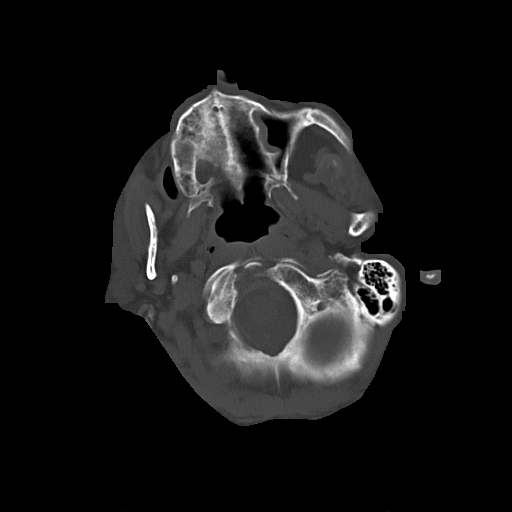
[im 7/33  bone]
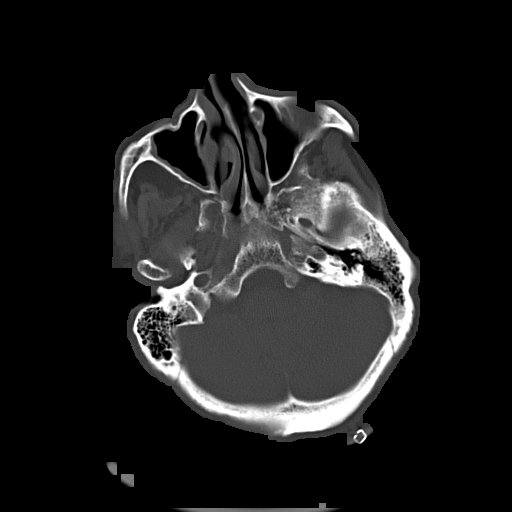

[15 of 30 positions shown; findings below may reference images not displayed]

FINDINGS: Right posterior temporal periventricular hemorrhage again noted and
slightly improved. Intraventricular hemorrhage with layering blood
products in the ventricles right greater than left similar to the
prior study. No new hemorrhage.

Generalized atrophy. Diffuse ventricular enlargement is unchanged
from prior studies and most compatible with atrophy. No definite
ventricular obstruction.

Extensive chronic microvascular ischemic changes in the white
matter. Chronic infarcts in the thalamus bilaterally. No acute
infarct or mass

Mucosal edema in the paranasal sinuses.
IMPRESSION: Improving subependymal hemorrhage in the right posterior temporal
lobe. Intraventricular hemorrhage unchanged. Diffuse ventricular
enlargement compatible with atrophy is unchanged. Advanced chronic
ischemic changes.
